# Patient Record
Sex: Female | Born: 1990 | Race: Black or African American | Hispanic: No | Marital: Single | Smoking: Current every day smoker
Health system: Southern US, Community
[De-identification: ages and names within clinical notes are randomized; demographics above are authoritative.]

## PROBLEM LIST (undated history)

## (undated) ENCOUNTER — Inpatient Hospital Stay: Payer: Self-pay

## (undated) DIAGNOSIS — F32A Depression, unspecified: Secondary | ICD-10-CM

## (undated) DIAGNOSIS — R519 Headache, unspecified: Secondary | ICD-10-CM

## (undated) DIAGNOSIS — F319 Bipolar disorder, unspecified: Secondary | ICD-10-CM

## (undated) DIAGNOSIS — F1911 Other psychoactive substance abuse, in remission: Secondary | ICD-10-CM

## (undated) DIAGNOSIS — O09299 Supervision of pregnancy with other poor reproductive or obstetric history, unspecified trimester: Secondary | ICD-10-CM

## (undated) DIAGNOSIS — A6000 Herpesviral infection of urogenital system, unspecified: Secondary | ICD-10-CM

## (undated) DIAGNOSIS — F419 Anxiety disorder, unspecified: Secondary | ICD-10-CM

## (undated) DIAGNOSIS — D649 Anemia, unspecified: Secondary | ICD-10-CM

## (undated) DIAGNOSIS — F329 Major depressive disorder, single episode, unspecified: Secondary | ICD-10-CM

## (undated) HISTORY — DX: Headache, unspecified: R51.9

## (undated) HISTORY — DX: Anxiety disorder, unspecified: F41.9

## (undated) HISTORY — DX: Bipolar disorder, unspecified: F31.9

## (undated) HISTORY — DX: Anemia, unspecified: D64.9

## (undated) HISTORY — DX: Depression, unspecified: F32.A

## (undated) HISTORY — PX: NO PAST SURGERIES: SHX2092

---
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
## 2013-03-15 ENCOUNTER — Emergency Department: Payer: Self-pay | Admitting: Emergency Medicine

## 2013-03-15 LAB — URINALYSIS, COMPLETE
Bilirubin,UR: NEGATIVE
Blood: NEGATIVE
Squamous Epithelial: 16
WBC UR: 19 /HPF (ref 0–5)

## 2013-03-15 LAB — WET PREP, GENITAL

## 2014-02-17 ENCOUNTER — Emergency Department: Payer: Self-pay | Admitting: Emergency Medicine

## 2014-02-17 LAB — CBC
HCT: 33.2 % — AB (ref 35.0–47.0)
HGB: 10.6 g/dL — ABNORMAL LOW (ref 12.0–16.0)
MCH: 27.5 pg (ref 26.0–34.0)
MCHC: 31.9 g/dL — ABNORMAL LOW (ref 32.0–36.0)
MCV: 86 fL (ref 80–100)
PLATELETS: 327 10*3/uL (ref 150–440)
RBC: 3.86 10*6/uL (ref 3.80–5.20)
RDW: 13.7 % (ref 11.5–14.5)
WBC: 13.6 10*3/uL — ABNORMAL HIGH (ref 3.6–11.0)

## 2014-02-17 LAB — COMPREHENSIVE METABOLIC PANEL
ALBUMIN: 2.7 g/dL — AB (ref 3.4–5.0)
ANION GAP: 5 — AB (ref 7–16)
AST: 16 U/L (ref 15–37)
Alkaline Phosphatase: 132 U/L — ABNORMAL HIGH
BUN: 3 mg/dL — ABNORMAL LOW (ref 7–18)
Bilirubin,Total: 0.3 mg/dL (ref 0.2–1.0)
CHLORIDE: 110 mmol/L — AB (ref 98–107)
CO2: 23 mmol/L (ref 21–32)
Calcium, Total: 8.4 mg/dL — ABNORMAL LOW (ref 8.5–10.1)
Creatinine: 0.52 mg/dL — ABNORMAL LOW (ref 0.60–1.30)
Glucose: 75 mg/dL (ref 65–99)
OSMOLALITY: 271 (ref 275–301)
Potassium: 3.9 mmol/L (ref 3.5–5.1)
SGPT (ALT): 9 U/L — ABNORMAL LOW (ref 12–78)
Sodium: 138 mmol/L (ref 136–145)
TOTAL PROTEIN: 6.7 g/dL (ref 6.4–8.2)

## 2014-03-30 ENCOUNTER — Inpatient Hospital Stay: Payer: Self-pay

## 2014-03-30 DIAGNOSIS — O1495 Unspecified pre-eclampsia, complicating the puerperium: Secondary | ICD-10-CM

## 2014-03-30 LAB — CBC WITH DIFFERENTIAL/PLATELET
BASOS ABS: 0 10*3/uL (ref 0.0–0.1)
BASOS PCT: 0.3 %
EOS PCT: 1.2 %
Eosinophil #: 0.2 10*3/uL (ref 0.0–0.7)
HCT: 34.2 % — AB (ref 35.0–47.0)
HGB: 11.3 g/dL — ABNORMAL LOW (ref 12.0–16.0)
LYMPHS ABS: 1.7 10*3/uL (ref 1.0–3.6)
LYMPHS PCT: 13.8 %
MCH: 27.7 pg (ref 26.0–34.0)
MCHC: 33.1 g/dL (ref 32.0–36.0)
MCV: 84 fL (ref 80–100)
Monocyte #: 1.1 x10 3/mm — ABNORMAL HIGH (ref 0.2–0.9)
Monocyte %: 8.6 %
NEUTROS PCT: 76.1 %
Neutrophil #: 9.5 10*3/uL — ABNORMAL HIGH (ref 1.4–6.5)
Platelet: 341 10*3/uL (ref 150–440)
RBC: 4.09 10*6/uL (ref 3.80–5.20)
RDW: 14.6 % — ABNORMAL HIGH (ref 11.5–14.5)
WBC: 12.5 10*3/uL — AB (ref 3.6–11.0)

## 2014-03-30 LAB — DRUG SCREEN, URINE
AMPHETAMINES, UR SCREEN: NEGATIVE (ref ?–1000)
BARBITURATES, UR SCREEN: NEGATIVE (ref ?–200)
BENZODIAZEPINE, UR SCRN: NEGATIVE (ref ?–200)
CANNABINOID 50 NG, UR ~~LOC~~: POSITIVE (ref ?–50)
Cocaine Metabolite,Ur ~~LOC~~: NEGATIVE (ref ?–300)
MDMA (Ecstasy)Ur Screen: NEGATIVE (ref ?–500)
Methadone, Ur Screen: NEGATIVE (ref ?–300)
OPIATE, UR SCREEN: NEGATIVE (ref ?–300)
PHENCYCLIDINE (PCP) UR S: NEGATIVE (ref ?–25)
Tricyclic, Ur Screen: NEGATIVE (ref ?–1000)

## 2014-03-31 LAB — HEMATOCRIT: HCT: 32.4 % — AB (ref 35.0–47.0)

## 2014-03-31 LAB — GC/CHLAMYDIA PROBE AMP

## 2014-04-05 ENCOUNTER — Inpatient Hospital Stay: Payer: Self-pay | Admitting: Internal Medicine

## 2014-04-05 LAB — CBC
HCT: 34.7 % — ABNORMAL LOW (ref 35.0–47.0)
HGB: 11.2 g/dL — AB (ref 12.0–16.0)
MCH: 27.6 pg (ref 26.0–34.0)
MCHC: 32.4 g/dL (ref 32.0–36.0)
MCV: 85 fL (ref 80–100)
PLATELETS: 379 10*3/uL (ref 150–440)
RBC: 4.07 10*6/uL (ref 3.80–5.20)
RDW: 15.2 % — ABNORMAL HIGH (ref 11.5–14.5)
WBC: 10.1 10*3/uL (ref 3.6–11.0)

## 2014-04-05 LAB — COMPREHENSIVE METABOLIC PANEL
ALBUMIN: 2.8 g/dL — AB (ref 3.4–5.0)
ALT: 55 U/L (ref 12–78)
Alkaline Phosphatase: 140 U/L — ABNORMAL HIGH
Anion Gap: 4 — ABNORMAL LOW (ref 7–16)
BUN: 7 mg/dL (ref 7–18)
Bilirubin,Total: 0.4 mg/dL (ref 0.2–1.0)
CALCIUM: 8.3 mg/dL — AB (ref 8.5–10.1)
Chloride: 114 mmol/L — ABNORMAL HIGH (ref 98–107)
Co2: 22 mmol/L (ref 21–32)
Creatinine: 0.89 mg/dL (ref 0.60–1.30)
EGFR (Non-African Amer.): >60
Glucose: 71 mg/dL (ref 65–99)
Osmolality: 276 (ref 275–301)
Potassium: 3.9 mmol/L (ref 3.5–5.1)
SGOT(AST): 43 U/L — ABNORMAL HIGH (ref 15–37)
SODIUM: 140 mmol/L (ref 136–145)
Total Protein: 6.7 g/dL (ref 6.4–8.2)

## 2014-04-05 LAB — URINALYSIS, COMPLETE
BILIRUBIN, UR: NEGATIVE
Bacteria: NONE SEEN
GLUCOSE, UR: NEGATIVE mg/dL (ref 0–75)
Ketone: NEGATIVE
NITRITE: NEGATIVE
PH: 6 (ref 4.5–8.0)
Protein: 100
RBC,UR: 17 /HPF (ref 0–5)
SPECIFIC GRAVITY: 1.018 (ref 1.003–1.030)
WBC UR: 35 /HPF (ref 0–5)

## 2014-04-05 LAB — TROPONIN I

## 2014-04-05 LAB — PRO B NATRIURETIC PEPTIDE: B-TYPE NATIURETIC PEPTID: 1462 pg/mL — AB (ref 0–125)

## 2014-04-05 LAB — CK TOTAL AND CKMB (NOT AT ARMC)
CK, TOTAL: 158 U/L
CK-MB: 1.8 ng/mL (ref 0.5–3.6)

## 2014-04-05 LAB — D-DIMER(ARMC): D-Dimer: 5541 ng/ml

## 2014-04-05 LAB — MAGNESIUM: Magnesium: 1.8 mg/dL

## 2014-04-06 LAB — BASIC METABOLIC PANEL
ANION GAP: 7 (ref 7–16)
BUN: 5 mg/dL — ABNORMAL LOW (ref 7–18)
CREATININE: 0.88 mg/dL (ref 0.60–1.30)
Calcium, Total: 7.5 mg/dL — ABNORMAL LOW (ref 8.5–10.1)
Chloride: 109 mmol/L — ABNORMAL HIGH (ref 98–107)
Co2: 24 mmol/L (ref 21–32)
EGFR (African American): >60
GLUCOSE: 80 mg/dL (ref 65–99)
OSMOLALITY: 276 (ref 275–301)
Potassium: 3.4 mmol/L — ABNORMAL LOW (ref 3.5–5.1)
Sodium: 140 mmol/L (ref 136–145)

## 2014-04-06 LAB — CBC WITH DIFFERENTIAL/PLATELET
BASOS ABS: 0 10*3/uL (ref 0.0–0.1)
Basophil %: 0.4 %
EOS ABS: 0.3 10*3/uL (ref 0.0–0.7)
Eosinophil %: 2.4 %
HCT: 33.8 % — AB (ref 35.0–47.0)
HGB: 10.8 g/dL — AB (ref 12.0–16.0)
Lymphocyte #: 1.9 10*3/uL (ref 1.0–3.6)
Lymphocyte %: 15.7 %
MCH: 27 pg (ref 26.0–34.0)
MCHC: 31.9 g/dL — AB (ref 32.0–36.0)
MCV: 85 fL (ref 80–100)
MONO ABS: 0.9 x10 3/mm (ref 0.2–0.9)
MONOS PCT: 7 %
NEUTROS PCT: 74.5 %
Neutrophil #: 9.2 10*3/uL — ABNORMAL HIGH (ref 1.4–6.5)
Platelet: 377 10*3/uL (ref 150–440)
RBC: 3.98 10*6/uL (ref 3.80–5.20)
RDW: 15.1 % — AB (ref 11.5–14.5)
WBC: 12.4 10*3/uL — AB (ref 3.6–11.0)

## 2014-04-06 LAB — HCG, QUANTITATIVE, PREGNANCY: Beta Hcg, Quant.: 19 m[IU]/mL — ABNORMAL HIGH

## 2014-04-06 LAB — MAGNESIUM: MAGNESIUM: 5.4 mg/dL — AB

## 2014-04-07 LAB — BASIC METABOLIC PANEL
ANION GAP: 10 (ref 7–16)
BUN: 8 mg/dL (ref 7–18)
CALCIUM: 8.4 mg/dL — AB (ref 8.5–10.1)
CHLORIDE: 109 mmol/L — AB (ref 98–107)
CO2: 21 mmol/L (ref 21–32)
Creatinine: 0.99 mg/dL (ref 0.60–1.30)
EGFR (African American): >60
EGFR (Non-African Amer.): >60
Glucose: 71 mg/dL (ref 65–99)
Osmolality: 276 (ref 275–301)
POTASSIUM: 3.7 mmol/L (ref 3.5–5.1)
SODIUM: 140 mmol/L (ref 136–145)

## 2014-04-07 LAB — CBC WITH DIFFERENTIAL/PLATELET
BASOS ABS: 0 10*3/uL (ref 0.0–0.1)
BASOS PCT: 0.3 %
EOS ABS: 0.3 10*3/uL (ref 0.0–0.7)
Eosinophil %: 2.4 %
HCT: 34.9 % — ABNORMAL LOW (ref 35.0–47.0)
HGB: 11.3 g/dL — ABNORMAL LOW (ref 12.0–16.0)
LYMPHS PCT: 14.7 %
Lymphocyte #: 1.5 10*3/uL (ref 1.0–3.6)
MCH: 27.4 pg (ref 26.0–34.0)
MCHC: 32.5 g/dL (ref 32.0–36.0)
MCV: 84 fL (ref 80–100)
MONOS PCT: 7.8 %
Monocyte #: 0.8 x10 3/mm (ref 0.2–0.9)
NEUTROS ABS: 7.7 10*3/uL — AB (ref 1.4–6.5)
Neutrophil %: 74.8 %
PLATELETS: 408 10*3/uL (ref 150–440)
RBC: 4.14 10*6/uL (ref 3.80–5.20)
RDW: 15.3 % — AB (ref 11.5–14.5)
WBC: 10.3 10*3/uL (ref 3.6–11.0)

## 2014-04-07 LAB — URINE CULTURE

## 2014-11-06 NOTE — L&D Delivery Note (Signed)
Delivery Note At 7:51 AM a viable female infant was delivered via Vaginal, Spontaneous Delivery (Presentation: Left Occiput Anterior).  APGAR: 8, 9; weight 7 lb 4.4 oz (3300 g).   Placenta status: Intact, Spontaneous. With three vessel cord  Anesthesia: None  Episiotomy: None Lacerations: None Suture Repair: N/A Est. Blood Loss (mL): 400  Mom to postpartum.  Baby to Couplet care / Skin to Skin.  GUTIERREZ, COLLEEN 07/04/2015, 8:15 AM

## 2014-12-16 LAB — OB RESULTS CONSOLE VARICELLA ZOSTER ANTIBODY, IGG: Varicella: IMMUNE

## 2014-12-16 LAB — OB RESULTS CONSOLE RUBELLA ANTIBODY, IGM: Rubella: IMMUNE

## 2015-02-21 ENCOUNTER — Observation Stay
Admit: 2015-02-21 | Disposition: A | Discharge status: Home / Self Care | Payer: Self-pay | Attending: Certified Nurse Midwife | Admitting: Certified Nurse Midwife

## 2015-02-22 ENCOUNTER — Encounter: Admit: 2015-02-22 | Payer: Self-pay | Admitting: Obstetrics and Gynecology

## 2015-02-27 NOTE — Discharge Summary (Signed)
PATIENT NAME:  Mary Terry, Mary Terry MR#:  [id number] DATE OF BIRTH:  Aug 23, 1991  DATE OF ADMISSION:  04/05/2014 DATE OF DISCHARGE:  04/07/2014  ADMITTING PHYSICIAN: Dr. Enid Terry  DISCHARGING PHYSICIAN:  Mary Baas, MD   PRIMARY CARE PHYSICIAN: None.  CONSULTATIONS IN THE HOSPITAL:  1.  OB/GYN consultation by Dr. Velora Terry.  2.  Surgical consultation by Dr. Juliann Terry.   DISCHARGE DIAGNOSES: 1.  Hypertensive urgency.  2.  Preeclampsia in the postpartum.  3.  Constipation.  4.  Postpartum vaginal bleeding.   DISCHARGE MEDICATIONS: 1.  Tylenol 650 mg q. 4 hours p.r.n. for pain or fever. 2.  Nifedipine 30 mg p.o. daily.  3.  Prenatal vitamins 1 tablet p.o. daily.   DISCHARGE DIET: Regular diet.   DISCHARGE ACTIVITY: As tolerated.  FOLLOWUP INSTRUCTIONS:  Follow up with OB/GYN, Dr. Tiburcio Terry in 2 days.  LABORATORY AND IMAGING STUDIES PRIOR TO DISCHARGE: Sodium 140, potassium 3.7, chloride 109, bicarb 21, BUN 8, creatinine 0.99, glucose 71 and calcium of 8.4.  WBC 10.3, hemoglobin 11.3, hematocrit 34.9, platelet count 408.  CT of the abdomen and pelvis with contrast showing constipation. No evidence of distal colonic obstruction and enlarged uterus, likely postpartum status noted.  Ultrasound of the pelvis showing thick and heterogeneous endometrial lining, likely endometritis. Enlarged and heterogeneous uterus with recent postpartum status noted.   Beta hCG slightly elevated at 19. KUB showing gaseous distention of primary colon with mild amount of stool and paucity of gas in the rectosigmoid colon, which is nonspecific.  CT angiography of the chest for PE showing no evidence of any pulmonary embolism.  Urinalysis:  1+ leuk esterase, no bacteria seen.  CT of the head without contrast showing normal CT head.  Urine cultures are negative.  BRIEF HOSPITAL COURSE:  Ms. Mary Terry is a 24 year old young African American female with no significant past medical history who  just had delivery, giving birth to a baby about a week ago, presents to the hospital secondary to severe chest pain, headache and right facial pain and noted to be in hypertensive urgency.  1.  Hypertensive urgency. Blood pressure is elevated on admission and she was requiring IV hydralazine and labetalol pushes as needed.  Other causes:  Her blood pressure has been, hypertension has been ruled out.  This is more likely secondary to eclampsia in the postpartum period, as patient is less than 1 week postpartum. She was seen by OB/GYN, admitted to Intensive Care Unit and started on magnesium infusion for 24 hours.  Her blood pressure is much better controlled and she is only on oral Procardia at this point, which she will be discharged on and followed up as an outpatient.  2.  Severe abdominal pain. The patient has severe abdominal pain day 2 of the hospitalization, and CT of the abdomen and pelvis was done and also pelvic ultrasound which showed gaseous distention and large uterus and possibly constipation. The patient was given laxatives, passed flatus and her pain has improved a lot. Her vaginal bleeding was slightly high and she was monitored under OB/GYN service and is being discharged home and will follow up with gynecology as an outpatient in 2 days.  3.  Her course has been otherwise uneventful in the hospital.   DISCHARGE CONDITION: Stable.   DISCHARGE DISPOSITION: Home.   TIME SPENT ON DISCHARGE:  40 minutes.   ____________________________ Mary Baas, MD rk:ce D: 04/07/2014 14:21:16 ET T: 04/07/2014 18:42:22 ET JOB#: [id number]  cc: Mary Baas, MD, <Dictator> R.  Annamarie MajorPaul Harris, MD Mary BaasADHIKA Laylee Schooley MD ELECTRONICALLY SIGNED 04/18/2014 14:18

## 2015-02-27 NOTE — H&P (Signed)
PATIENT NAME:  Mary Terry, ROVIRA MR#:  [id number] DATE OF BIRTH:  1991-06-03  DATE OF ADMISSION:  04/05/2014  ADMITTING PHYSICIAN: Enid Baas, M.D.   PRIMARY CARE PHYSICIAN: None.   CHIEF COMPLAINT: Headache.   HISTORY OF PRESENT ILLNESS:  Mary Terry is a 24 year old African American female with no significant past medical history, who has just given birth to a baby through normal vaginal delivery about 6 days ago. Comes to the hospital secondary to a severe headache and right facial pain. The patient had an uncomplicated pregnancy as well as delivery, got discharged about 4 days ago. One day after discharge, she started to have some headache which was all over the head going back to her neck. She thought it would get better; however, it got worse to the point that she had severe right facial pain, blurred vision, associated with that some dyspnea, chest pain and tightness secondary to dyspnea and presents to the hospital. She was noted to have proteinuria, hypertensive urgency with systolic blood pressure greater than 190s and being admitted for the same. No prior history of gestational hypertension or hypertension.   PAST MEDICAL HISTORY:  None.   PAST SURGICAL HISTORY: Normal vaginal delivery but no other surgeries.   ALLERGIES TO MEDICATIONS: No known drug allergies.   HOME MEDICATIONS:  Prenatal vitamin.   SOCIAL HISTORY: Lives at home. No history of any past history of smoking and no alcohol. Drug screen from last week was positive for marijuana.   FAMILY HISTORY:  No significant family history.   REVIEW OF SYSTEMS:    CONSTITUTIONAL: No fever, fatigue or weakness.  EYES: Positive for blurry vision. No inflammation, glaucoma or cataracts.  ENT: No tinnitus, ear pain, hearing loss, epistaxis or discharge.   RESPIRATORY:  Positive for dyspnea.  No cough, wheeze or chronic obstructive pulmonary disease.  CARDIOVASCULAR: Positive for chest pain, orthopnea, edema, arrhythmia,  palpitations, or syncope.  GASTROINTESTINAL: No nausea, vomiting, diarrhea, abdominal pain, hematemesis or melena.  GENITOURINARY: No dysuria, hematuria, renal calculus, frequency or incontinence.  ENDOCRINE: No polyuria, nocturia, thyroid problems or heat or cold intolerance.  HEMATOLOGY: Mild bleeding from vagina which is expected, but no other bleeding, bruising or anemia.  SKIN: No acne, rash or lesions.  MUSCULOSKELETAL: Neck pain present, no other arthritis or gout.  NEUROLOGIC: No numbness, weakness, cerebrovascular accident, transient ischemic attack or seizures.  PSYCHOLOGICAL: No anxiety, insomnia or depression.   PHYSICAL EXAMINATION: VITAL SIGNS: Temperature 98.4 degrees Fahrenheit, pulse 92, respirations 18, blood pressure 172/111, pulse oximetry 99% on room air.  GENERAL: Well-built, well-nourished female lying in bed, not in any acute distress.  HEENT: Normocephalic, atraumatic.  Pupils equal, round, reacting to light. Anicteric sclerae. Extraocular movements intact. Oropharynx clear without erythema, mass or exudates.  NECK: Supple. No thyromegaly, JVD or carotid bruits. No lymphadenopathy.  LUNGS: Moving air bilaterally. No wheeze or crackles. No use of accessory muscles for breathing.  CARDIOVASCULAR: S1 and S2.  Regular rate and rhythm. No murmurs, rubs or gallops.  ABDOMEN: Soft, nontender, nondistended. No hepatosplenomegaly. Normal bowel sounds.  EXTREMITIES: No pedal edema. No clubbing or cyanosis. Has 2+ distally pedis pulses palpable bilaterally.  SKIN: No acne, rash or lesions.  LYMPHATICS: No cervical lymphadenopathy.    NEUROLOGIC: Cranial nerves intact. No focal motor or sensory deficits.  PSYCHOLOGIC: The patient is awake, alert, oriented x 3.   LABORATORY, DIAGNOSTIC AND RADIOLOGICAL DATA: 1.  WBC 10.1, hemoglobin 11.3, hematocrit 34.7, platelet count is 379.  2.  Sodium 140, potassium  3.9, chloride 114, bicarbonate 22, BUN 10, creatinine 0.89, glucose 91,  calcium of 8.3.  3.  ALT 55, AST 43, alkaline phosphatase 142, total bilirubin 0.4, albumin of 3.8.  4.  Troponin less than 0.02. D-dimer 5541.  BNP is elevated at 1462. Magnesium normal at 1.8.  5.  Urinalysis with 1+ leukocyte esterase, 100 mg/dL of protein, no bacteria seen.  6.  Chest x-ray showing clear lung fields, no acute cardiopulmonary disease.  7.  CT of the head without contrast showing normal CT head without any acute changes.  8.  CT angiogram of the chest showing no evidence of any PE.  9.  EKG showing just normal sinus rhythm, no acute ST-T wave abnormalities, heart rate of 62.   ASSESSMENT AND PLAN: A 24 year old female with no past medical history, 1 week postpartum, comes with severe headache and dyspnea, noted to have hypertension.  1.  Hypertensive urgency with accelerated hypertension, no other causes. Likely preeclampsia and postpartum.  2.  Proteinuria, hypertension and elevated liver function tests. Admit to Critical Care Unit for magnesium infusion, IV hydralazine and labetalol pushes p.r.n. If does not improve, we will do a  continuous drip.   3.  Preeclampsia. Continue monitoring patient for 24 hours. Obgyn has been consulted. The patient will hold breast-feeding anyway for 24 hours as contrast given for CT chest. Continue to monitor.  4.  Headache and right facial pain. CT of the head is normal. Pain medications p.r.n.  5.  CODE STATUS: Full code.   TIME SPENT ON ADMISSION: 50 minutes.    ____________________________ Enid Baasadhika Dejon Lukas, MD rk:cs D: 04/05/2014 18:06:16 ET T: 04/05/2014 18:35:52 ET JOB#: [id number]  cc: Enid Baasadhika Adom Schoeneck, MD, <Dictator> Enid BaasADHIKA Donell Tomkins MD ELECTRONICALLY SIGNED 04/18/2014 14:14

## 2015-02-27 NOTE — Consult Note (Signed)
Brief Consult Note: Diagnosis: Hypertension Urgency.  Postpartum, possible preclampsia.   Patient was seen by consultant.   Consult note dictated.   Recommend further assessment or treatment.   Discussed with Attending MD.   Comments: AGree w plan for CCU admission for Magnesium IV therapy as well as antihypertensive therapy.  Anticipate reversal of symptoms, patient has no prior HTN disorders and is young and healthy.  Breast feeding.  Will follow w you.  Electronic Signatures: Letitia LibraHarris, Latrell Potempa Paul (MD)  (Signed 31-May-15 17:52)  Authored: Brief Consult Note   Last Updated: 31-May-15 17:52 by Letitia LibraHarris, Oluwatobi Ruppe Paul (MD)

## 2015-02-27 NOTE — Consult Note (Signed)
PATIENT NAME:  Mary Terry, [name] MR#:  [id number] DATE OF BIRTH:  10-02-1991  DATE OF CONSULTATION:  04/06/2014  CONSULTING PHYSICIAN:  Cristal Deerhristopher A. Perseus Westall, MD  REASON FOR CONSULTATION: Suprapubic abdominal pain, dilation of proximal colon.   HISTORY OF PRESENT ILLNESS: Mary Terry is a pleasant 24 year old female who was recently admitted for normal vaginal delivery approximately 7 days ago. She presented yesterday with severe headache, right facial pain, was noted to have proteinuria and hypertensive urgency, which has since improved.  Pain had since improved; however, she developed suprapubic abdominal pain this morning. She says that since surgery, she did not have a bowel movement for approximately 3 days and then had a small bowel movement on day 4 and only had a recent significant bowel movement yesterday. She says that she feels better after passing gas today. Otherwise, no fevers, chills, night sweats, shortness of breath, cough, chest pain, dysuria or hematuria.   PAST MEDICAL HISTORY:  Admitted for hypertensive urgency, proteinuria, preeclampsia.    PAST SURGICAL HISTORY:  No history of surgeries.   ALLERGIES:  No known drug allergies.   HOME MEDICATIONS:  Prenatal vitamins.  SOCIAL HISTORY:  Denies alcohol or tobacco. Positive last week drug screen for marijuana.   FAMILY HISTORY: No significant family history.   REVIEW OF SYSTEMS: A 12 point review of systems review of systems was obtained. Pertinent positives and negatives as above.   PHYSICAL EXAMINATION: VITAL SIGNS: Temperature 97.5, pulse 82, blood pressure 127/76, O2 saturation is 100%.  GENERAL: No acute distress. Alert and oriented x 3.  HEAD: Normocephalic, atraumatic.  EYES: No scleral icterus. No conjunctivitis.  FACE: No obvious facial trauma. Normal external nose. Normal external ears.  CHEST: Lungs clear to auscultation, moving air well.  HEART: Regular rate and rhythm. No murmurs, rubs or gallops.   ABDOMEN: Soft, mildly tender to palpation in the suprapubic regions.  EXTREMITIES: Moves all extremities well. Strength 5/5.  NEUROLOGIC: Cranial nerves II through XII grossly intact.   LABORATORY DATA:  Significant for white cell count of 12.4, hemoglobin 10.8, hematocrit 33.8. Neutrophils are 74.5. Urinalysis positive for 1+ leukocyte esterase, 35 white cells.   IMAGING: KUB shows a proximal dilated colon with gaseous distention and possibly gas in the retrosigmoid colon.   ASSESSMENT AND PLAN: Mary Terry is a pleasant 24 year old female status post spontaneous vaginal delivery and likely preeclampsia, is worked up with ultrasound by OB for retained gestational products. The patient does have suprapubic pain.  Do not feel that it is appendicitis; however, could bowel gas findings be associated with large post gravid uterus or possibly a periuterine hematoma, the patient's blood count has not significantly dropped.  We will follow up on  p.o. contrast CT scan. No obvious surgical intervention was noted at this time.   ____________________________ Si Raiderhristopher A. Dorraine Ellender, MD cal:dmm D: 04/06/2014 16:09:07 ET T: 04/06/2014 19:02:48 ET JOB#: [id number]  cc: Cristal Deerhristopher A. Quantisha Marsicano, MD, <Dictator> Jarvis NewcomerHRISTOPHER A Daveena Elmore MD ELECTRONICALLY SIGNED 04/07/2014 17:18

## 2015-03-16 NOTE — H&P (Signed)
L&D Evaluation:  History:  HPI Pt is a 24 yo G2P1 at 39.[redacted] weeks GA with an EDC of 03/31/14 who presents to L&D with reports of contractions that started at 9 am this morning. She reports +FM, denies vb or lof. 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  HEENT, CNS, GI, GU, Respiratory, CV, Renal and Musculoskeletal systems were found to be normal.   Exam:  Vital Signs stable   General no apparent distress   Mental Status clear   Chest clear   Heart normal sinus rhythm   Abdomen gravid, tender with contractions   Pelvic 6/90/-1  AROM at 2144 for clear fluid   Mebranes Intact   FHT normal rate with no decels, 120-130's   Ucx regular, Q5   Skin dry, no lesions, no rashes   Lymph no lymphadenopathy   Impression:  Impression active labor, reactive NST   Plan:  Plan EFM/NST, monitor contractions and for cervical change, anticipate svd, AROM   Follow Up Appointment need to schedule. in 6 weeks   Electronic Signatures: Jannet MantisSubudhi, Ari Engelbrecht (CNM)  (Signed 25-May-15 23:11)  Authored: L&D Evaluation   Last Updated: 25-May-15 23:11 by Jannet MantisSubudhi, [place] Dube (CNM)

## 2015-05-03 ENCOUNTER — Encounter: Payer: Self-pay | Admitting: *Deleted

## 2015-05-03 ENCOUNTER — Observation Stay
Admission: EM | Admit: 2015-05-03 | Discharge: 2015-05-03 | Disposition: A | Discharge status: Home / Self Care | Payer: Medicaid Other | Attending: Certified Nurse Midwife | Admitting: Certified Nurse Midwife

## 2015-05-03 DIAGNOSIS — Z3490 Encounter for supervision of normal pregnancy, unspecified, unspecified trimester: Secondary | ICD-10-CM | POA: Insufficient documentation

## 2015-05-03 DIAGNOSIS — Z3A31 31 weeks gestation of pregnancy: Secondary | ICD-10-CM | POA: Insufficient documentation

## 2015-05-03 DIAGNOSIS — N766 Ulceration of vulva: Secondary | ICD-10-CM

## 2015-05-03 DIAGNOSIS — O23593 Infection of other part of genital tract in pregnancy, third trimester: Principal | ICD-10-CM | POA: Insufficient documentation

## 2015-05-03 DIAGNOSIS — R102 Pelvic and perineal pain: Secondary | ICD-10-CM | POA: Diagnosis present

## 2015-05-03 DIAGNOSIS — B9689 Other specified bacterial agents as the cause of diseases classified elsewhere: Secondary | ICD-10-CM | POA: Diagnosis not present

## 2015-05-03 DIAGNOSIS — N760 Acute vaginitis: Secondary | ICD-10-CM | POA: Insufficient documentation

## 2015-05-03 DIAGNOSIS — N9489 Other specified conditions associated with female genital organs and menstrual cycle: Secondary | ICD-10-CM | POA: Diagnosis present

## 2015-05-03 LAB — CHLAMYDIA/NGC RT PCR (ARMC ONLY)
CHLAMYDIA TR: NOT DETECTED
N GONORRHOEAE: NOT DETECTED

## 2015-05-03 LAB — ABO/RH: ABO/RH(D): O POS

## 2015-05-03 MED ORDER — FLUCONAZOLE 50 MG PO TABS
ORAL_TABLET | ORAL | Status: AC
  Administered 2015-05-03: 150 mg via ORAL
  Filled 2015-05-03: qty 3

## 2015-05-03 MED ORDER — ACYCLOVIR 5 % EX OINT
TOPICAL_OINTMENT | CUTANEOUS | Status: DC
  Administered 2015-05-03: 12:00:00 via TOPICAL
  Filled 2015-05-03: qty 30

## 2015-05-03 MED ORDER — VALACYCLOVIR HCL 500 MG PO TABS
1000.0000 mg | ORAL_TABLET | Freq: Two times a day (BID) | ORAL | Status: DC
  Administered 2015-05-03: 1000 mg via ORAL
  Filled 2015-05-03 (×2): qty 2

## 2015-05-03 MED ORDER — NYSTATIN-TRIAMCINOLONE 100000-0.1 UNIT/GM-% EX CREA
TOPICAL_CREAM | Freq: Two times a day (BID) | CUTANEOUS | Status: DC | PRN
  Administered 2015-05-03: 12:00:00 via TOPICAL
  Filled 2015-05-03: qty 15

## 2015-05-03 MED ORDER — FLUCONAZOLE 50 MG PO TABS
150.0000 mg | ORAL_TABLET | Freq: Once | ORAL | Status: AC
  Administered 2015-05-03: 150 mg via ORAL

## 2015-05-03 NOTE — Discharge Summary (Addendum)
Pt d/c'd to home with family. CNM saw pt and explained prescription and care of HSV during pregnancy. Prescriptions and d/c instructions given. Pt verbalized understanding. Follow up appointment made previously. Pt in stable condition upon d/c. No further concerns.

## 2015-05-03 NOTE — OB Triage Note (Signed)
Vaginal pain that started two days ago with some burning

## 2015-05-03 NOTE — Discharge Instructions (Signed)
Get plenty of rest, drink plenty of fluids

## 2015-05-04 LAB — TYPE AND SCREEN
ABO/RH(D): O POS
ANTIBODY SCREEN: NEGATIVE
DAT, IgG: NEGATIVE
WEAK D: POSITIVE

## 2015-05-04 LAB — HSV(HERPES SIMPLEX VRS) I + II AB-IGG: HSV 2 Glycoprotein G Ab, IgG: 1.3 index — ABNORMAL HIGH (ref 0.00–0.90)

## 2015-05-04 LAB — HSV CULTURE AND TYPING

## 2015-05-04 NOTE — Progress Notes (Signed)
S: Presents to L&D who complaints of a swollen, painful vaginal area x2 days.  Also reports burning of skin when urinating. Denies discharge or vulvar itching. Baby active.  O: 24 year old G3 76P2002 with EDC=07/05/2015 by LMP and c/b 11 week ultrasound presents at 31 week with above complaints.PNC at [facility] remarkable for obesity, smoking, past hx of cocaine and MJ use but negative UDS in January, and a hx of postpartum preeclampsia. Not seen between 22 and 30 weeks for prenatal care. Negative first trimester test, but had Down risk of 1/300.  Normal anatomy scan. Positive RPR, but negative TP test. Has a hx of positive HSV II IGG, but denies HSV outbreaks in past  Exam: 114/53 Appears uncomfortable Vulva: swollen right lower labia majora with several small ulcerations noted that are very tender to touch Vagina: white discharge Wet prep positive for hyphae and negative for Trich and clue cells HSV culture done on lesions Cervix closed, and long FHR 140 baseline with accelserations to 160 to 180, moderate variability Toco: acontractile  A: IUP at 31 weeks with suspected HSV outbreak Monilial vulvovaginaitis Reactive NST  P: HSVII IGG (did not see she had positive IGG prior to ordering) Start Valtrex 1 gm BID x 7 days then daily until she delivers Acyclovir ointment q3hours topically Diflucan 150 mgm x1 Mycolog II BID [facility] Can use cool or warm soaks or compresses

## 2015-05-05 LAB — HSV CULTURE AND TYPING

## 2015-05-13 LAB — OB RESULTS CONSOLE HIV ANTIBODY (ROUTINE TESTING): HIV: NONREACTIVE

## 2015-05-13 LAB — OB RESULTS CONSOLE RPR: RPR: REACTIVE

## 2015-06-07 LAB — OB RESULTS CONSOLE GBS
GBS: NEGATIVE
GBS: POSITIVE
STREP GROUP B AG: POSITIVE

## 2015-06-07 LAB — OB RESULTS CONSOLE GC/CHLAMYDIA
Chlamydia: NEGATIVE
Gonorrhea: NEGATIVE

## 2015-06-08 LAB — OB RESULTS CONSOLE GBS: STREP GROUP B AG: POSITIVE

## 2015-06-27 ENCOUNTER — Encounter: Payer: Self-pay | Admitting: *Deleted

## 2015-06-27 ENCOUNTER — Observation Stay
Admission: EM | Admit: 2015-06-27 | Discharge: 2015-06-27 | Disposition: A | Discharge status: Home / Self Care | Payer: Medicaid Other | Attending: Obstetrics and Gynecology | Admitting: Obstetrics and Gynecology

## 2015-06-27 DIAGNOSIS — R519 Headache, unspecified: Secondary | ICD-10-CM | POA: Diagnosis present

## 2015-06-27 DIAGNOSIS — R51 Headache: Secondary | ICD-10-CM | POA: Diagnosis present

## 2015-06-27 DIAGNOSIS — O26893 Other specified pregnancy related conditions, third trimester: Secondary | ICD-10-CM | POA: Diagnosis not present

## 2015-06-27 DIAGNOSIS — Z3A38 38 weeks gestation of pregnancy: Secondary | ICD-10-CM | POA: Diagnosis not present

## 2015-06-27 LAB — COMPREHENSIVE METABOLIC PANEL
ALK PHOS: 197 U/L — AB (ref 38–126)
ALT: 11 U/L — ABNORMAL LOW (ref 14–54)
AST: 16 U/L (ref 15–41)
Albumin: 2.7 g/dL — ABNORMAL LOW (ref 3.5–5.0)
Anion gap: 8 (ref 5–15)
BUN: <5 mg/dL — ABNORMAL LOW (ref 6–20)
CHLORIDE: 107 mmol/L (ref 101–111)
CO2: 21 mmol/L — AB (ref 22–32)
CREATININE: 0.64 mg/dL (ref 0.44–1.00)
Calcium: 8.6 mg/dL — ABNORMAL LOW (ref 8.9–10.3)
GFR calc Af Amer: >60 mL/min (ref >60)
GFR calc non Af Amer: >60 mL/min (ref >60)
GLUCOSE: 76 mg/dL (ref 65–99)
Potassium: 3.9 mmol/L (ref 3.5–5.1)
SODIUM: 136 mmol/L (ref 135–145)
Total Bilirubin: 0.4 mg/dL (ref 0.3–1.2)
Total Protein: 6.3 g/dL — ABNORMAL LOW (ref 6.5–8.1)

## 2015-06-27 LAB — PROTEIN / CREATININE RATIO, URINE
CREATININE, URINE: 74 mg/dL
Protein Creatinine Ratio: 0.16 mg/mg{Cre} — ABNORMAL HIGH (ref 0.00–0.15)
TOTAL PROTEIN, URINE: 12 mg/dL

## 2015-06-27 LAB — URINALYSIS COMPLETE WITH MICROSCOPIC (ARMC ONLY)
Bilirubin Urine: NEGATIVE
GLUCOSE, UA: NEGATIVE mg/dL
HGB URINE DIPSTICK: NEGATIVE
LEUKOCYTES UA: NEGATIVE
Nitrite: NEGATIVE
Protein, ur: NEGATIVE mg/dL
Specific Gravity, Urine: 1.009 (ref 1.005–1.030)
pH: 7 (ref 5.0–8.0)

## 2015-06-27 LAB — CBC
HCT: 32.2 % — ABNORMAL LOW (ref 35.0–47.0)
Hemoglobin: 10.4 g/dL — ABNORMAL LOW (ref 12.0–16.0)
MCH: 25.8 pg — ABNORMAL LOW (ref 26.0–34.0)
MCHC: 32.2 g/dL (ref 32.0–36.0)
MCV: 80 fL (ref 80.0–100.0)
PLATELETS: 305 10*3/uL (ref 150–440)
RBC: 4.02 MIL/uL (ref 3.80–5.20)
RDW: 16 % — ABNORMAL HIGH (ref 11.5–14.5)
WBC: 12.1 10*3/uL — AB (ref 3.6–11.0)

## 2015-06-27 MED ORDER — ACETAMINOPHEN 325 MG PO TABS
ORAL_TABLET | ORAL | Status: AC
  Administered 2015-06-27: 650 mg via ORAL
  Filled 2015-06-27: qty 2

## 2015-06-27 MED ORDER — ACETAMINOPHEN 325 MG PO TABS
650.0000 mg | ORAL_TABLET | ORAL | Status: DC | PRN
  Administered 2015-06-27: 650 mg via ORAL

## 2015-06-27 NOTE — OB Triage Note (Signed)
HA since 0700 this AM. Contractions since 0700. "Not close enough together." [name]

## 2015-06-27 NOTE — H&P (Signed)
Obstetric H&P   Chief Complaint: Headache  Prenatal Care Provider: [facility]  History of Present Illness: 24 y.o. [id number] [redacted]w[redacted]d by 07/05/2015, with a history of postpartum preeclampsia, normotensive thus far this pregnancy, who reports intractable headache starting this morning not relieved by Tylenol.  +FM, no LOF, no VB.  O pos / ABSC neg / RI / VZI / RPR reactive 1:4 with negative FTA antibodies / DSR 1:300 / negative MSAFP / hemoglobin AA / 1-hr 97 / GBS negative  History of THC and cocaine use  Review of Systems: 10 point review of systems negative unless otherwise noted in HPI  Past Medical History: History reviewed. No pertinent past medical history.  Past Surgical History: History reviewed. No pertinent past surgical history.  Obstetric History: OB History  Gravida Para Term Preterm AB SAB TAB Ectopic Multiple Living  3 2 2       2     # Outcome Date GA Lbr Len/2nd Weight Sex Delivery Anes PTL Lv  3 Current           2 Term 03/30/14    Reuben Likes  1 Term 01/15/10    F Vag-Spont   Y      Family History: Family History  Problem Relation Age of Onset  . Family history unknown: Yes    Social History: Social History   Social History  . Marital Status: Married    Spouse Name: N/A  . Number of Children: N/A  . Years of Education: N/A   Occupational History  . Not on file.   Social History Main Topics  . Smoking status: Current Every Day Smoker -- 0.25 packs/day for 3 years    Types: Cigarettes  . Smokeless tobacco: Never Used  . Alcohol Use: No  . Drug Use: Yes    Special: Marijuana  . Sexual Activity: Yes   Other Topics Concern  . Not on file   Social History Narrative    Medications: Prior to Admission medications   Medication Sig Start Date End Date Taking? Authorizing Provider  Prenatal Vit-Fe Fumarate-FA (MULTIVITAMIN-PRENATAL) 27-0.8 MG TABS tablet Take 1 tablet by mouth daily at 12 noon.   Yes Historical Provider, MD  valACYclovir (VALTREX) 1000 MG  tablet Take 500 mg by mouth daily. 04/27/15  Yes Historical Provider, MD  neomycin-bacitracin-polymyxin (NEOSPORIN) ointment Apply 1 application topically as needed for wound care. apply to eye    Historical Provider, MD    Allergies: No Known Allergies  Physical Exam: Filed Vitals:   06/27/15 1412  BP: 145/70  Pulse: 75  Temp: 98.3 F (36.8 C)  TempSrc: Oral  Resp: 20    Urine Dip Protein: P/C ratio pending  FHT: 130, moderate, +accels, no decels Toco: rare  General: NAD HEENT: normocephalic, anicteric Pulmonary: CTAB Cardiovascular: RRR Abdomen: Gravid,  Non-tender, non-distended Extremities: no edema  Labs: Results for orders placed or performed during the hospital encounter of 06/27/15 (from the past 24 hour(s))  Comprehensive metabolic panel     Status: Abnormal   Collection Time: 06/27/15  2:37 PM  Result Value Ref Range   Sodium 136 135 - 145 mmol/L   Potassium 3.9 3.5 - 5.1 mmol/L   Chloride 107 101 - 111 mmol/L   CO2 21 (L) 22 - 32 mmol/L   Glucose, Bld 76 65 - 99 mg/dL   BUN <5 (L) 6 - 20 mg/dL   Creatinine, Ser 4.54 0.44 - 1.00 mg/dL   Calcium 8.6 (L) 8.9 - 10.3  mg/dL   Total Protein 6.3 (L) 6.5 - 8.1 g/dL   Albumin 2.7 (L) 3.5 - 5.0 g/dL   AST 16 15 - 41 U/L   ALT 11 (L) 14 - 54 U/L   Alkaline Phosphatase 197 (H) 38 - 126 U/L   Total Bilirubin 0.4 0.3 - 1.2 mg/dL   GFR calc non Af Amer >60 >60 mL/min   GFR calc Af Amer >60 >60 mL/min   Anion gap 8 5 - 15  CBC     Status: Abnormal   Collection Time: 06/27/15  2:37 PM  Result Value Ref Range   WBC 12.1 (H) 3.6 - 11.0 K/uL   RBC 4.02 3.80 - 5.20 MIL/uL   Hemoglobin 10.4 (L) 12.0 - 16.0 g/dL   HCT 16.1 (L) 09.6 - 04.5 %   MCV 80.0 80.0 - 100.0 fL   MCH 25.8 (L) 26.0 - 34.0 pg   MCHC 32.2 32.0 - 36.0 g/dL   RDW 40.9 (H) 81.1 - 91.4 %   Platelets 305 150 - 440 K/uL    Assessment: 23 y.o. [id number] [redacted]w[redacted]d by 07/05/2015, presenting with headaches  Plan: 1) Elevated blood pressure  - PIH labs  negative - P/C ratio pending  2) Fetus - category I tracing  3) O pos / ABSC neg / RI / VZI / RPR reactive 1:4 with negative FTA antibodies / DSR 1:300 / negative MSAFP / hemoglobin AA / 1-hr 97 / GBS negative

## 2015-06-27 NOTE — Progress Notes (Signed)
Obstetric and Gynecology  Subjective  Headache has resolved after tylenol, some irregular contractions  Objective   Filed Vitals:   06/27/15 1412 06/27/15 1546 06/27/15 1600 06/27/15 1615  BP: 145/70 123/69 122/72 116/71  Pulse: 75 69 66 83  Temp: 98.3 F (36.8 C)     TempSrc: Oral     Resp: 20 20     No intake or output data in the 24 hours ending 06/27/15 1629  General: NAD Abdomen: gravid, soft nontender Cervix: 1.5/50/-3 Extremities:no edema  Labs: Results for orders placed or performed during the hospital encounter of 06/27/15 (from the past 24 hour(s))  Comprehensive metabolic panel     Status: Abnormal   Collection Time: 06/27/15  2:37 PM  Result Value Ref Range   Sodium 136 135 - 145 mmol/L   Potassium 3.9 3.5 - 5.1 mmol/L   Chloride 107 101 - 111 mmol/L   CO2 21 (L) 22 - 32 mmol/L   Glucose, Bld 76 65 - 99 mg/dL   BUN <5 (L) 6 - 20 mg/dL   Creatinine, Ser 6.96 0.44 - 1.00 mg/dL   Calcium 8.6 (L) 8.9 - 10.3 mg/dL   Total Protein 6.3 (L) 6.5 - 8.1 g/dL   Albumin 2.7 (L) 3.5 - 5.0 g/dL   AST 16 15 - 41 U/L   ALT 11 (L) 14 - 54 U/L   Alkaline Phosphatase 197 (H) 38 - 126 U/L   Total Bilirubin 0.4 0.3 - 1.2 mg/dL   GFR calc non Af Amer >60 >60 mL/min   GFR calc Af Amer >60 >60 mL/min   Anion gap 8 5 - 15  CBC     Status: Abnormal   Collection Time: 06/27/15  2:37 PM  Result Value Ref Range   WBC 12.1 (H) 3.6 - 11.0 K/uL   RBC 4.02 3.80 - 5.20 MIL/uL   Hemoglobin 10.4 (L) 12.0 - 16.0 g/dL   HCT 29.5 (L) 28.4 - 13.2 %   MCV 80.0 80.0 - 100.0 fL   MCH 25.8 (L) 26.0 - 34.0 pg   MCHC 32.2 32.0 - 36.0 g/dL   RDW 44.0 (H) 10.2 - 72.5 %   Platelets 305 150 - 440 K/uL  Protein / creatinine ratio, urine     Status: Abnormal   Collection Time: 06/27/15  3:15 PM  Result Value Ref Range   Creatinine, Urine 74 mg/dL   Total Protein, Urine 12 mg/dL   Protein Creatinine Ratio 0.16 (H) 0.00 - 0.15 mg/mg[Cre]  Urinalysis complete, with microscopic ([facility] only)      Status: Abnormal   Collection Time: 06/27/15  3:15 PM  Result Value Ref Range   Color, Urine YELLOW (A) YELLOW   APPearance CLEAR (A) CLEAR   Glucose, UA NEGATIVE NEGATIVE mg/dL   Bilirubin Urine NEGATIVE NEGATIVE   Ketones, ur 2+ (A) NEGATIVE mg/dL   Specific Gravity, Urine 1.009 1.005 - 1.030   Hgb urine dipstick NEGATIVE NEGATIVE   pH 7.0 5.0 - 8.0   Protein, ur NEGATIVE NEGATIVE mg/dL   Nitrite NEGATIVE NEGATIVE   Leukocytes, UA NEGATIVE NEGATIVE   RBC / HPF 0-5 0 - 5 RBC/hpf   WBC, UA 0-5 0 - 5 WBC/hpf   Bacteria, UA RARE (A) NONE SEEN   Squamous Epithelial / LPF 0-5 (A) NONE SEEN   Mucous PRESENT     Cultures: Results for orders placed or performed during the hospital encounter of 05/03/15  Hsv Culture And Typing     Status: None  Collection Time: 05/03/15 11:40 AM  Result Value Ref Range Status   HSV Culture/Type REORDERED. SEE [id number] FOR REPORT  Final   Source of Sample ENDOCERVICAL  Final  Chlamydia/NGC rt PCR ([facility] only)     Status: None   Collection Time: 05/03/15 11:40 AM  Result Value Ref Range Status   Specimen source GC/Chlam ENDOCERVICAL  Final  Chlamydia/NGC rt PCR ([facility] only)     Status: None   Collection Time: 05/03/15 11:40 AM  Result Value Ref Range Status   Specimen source GC/Chlam URINE, RANDOM  Final   Chlamydia Tr NOT DETECTED NOT DETECTED Final   N gonorrhoeae NOT DETECTED NOT DETECTED Final    Comment: (NOTE) 100  This methodology has not been evaluated in pregnant women or in 200  patients with a history of hysterectomy. 300 400  This methodology will not be performed on patients less than 20  years of age.   Hsv Culture And Typing     Status: Abnormal   Collection Time: 05/03/15 11:40 AM  Result Value Ref Range Status   HSV Culture/Type Comment (A)  Final    Comment: (NOTE) Positive for Herpes simplex virus type-2. Typing was confirmed by monoclonal antibody microscopic immunofluorescence. Performed At: [facility] [address] [place], [place] [id number] Mila Homer MD [id number]    Source of Sample HSV CULTURE & TYPING  Final    Imaging:  Assessment   24 y.o. G3P2002 at [redacted]w[redacted]d presenting with headache, resolved with administration of tyelnol  Plan   1) HA - no evidence of preeclampsia based on labs, other than initial systolic of 145 remainder of BP all well within the normotensive range.  Headache resolved with tylenol  2) Fetus - cat I tracing/reactive NST  3) Disposition - discharge home, follow up 06/28/15 [facility]

## 2015-07-04 ENCOUNTER — Encounter: Payer: Self-pay | Admitting: Certified Nurse Midwife

## 2015-07-04 ENCOUNTER — Inpatient Hospital Stay
Admission: EM | Admit: 2015-07-04 | Discharge: 2015-07-06 | DRG: 775 | Disposition: A | Discharge status: Home / Self Care | Payer: Medicaid Other | Attending: Certified Nurse Midwife | Admitting: Certified Nurse Midwife

## 2015-07-04 DIAGNOSIS — O99824 Streptococcus B carrier state complicating childbirth: Secondary | ICD-10-CM | POA: Diagnosis present

## 2015-07-04 DIAGNOSIS — O98319 Other infections with a predominantly sexual mode of transmission complicating pregnancy, unspecified trimester: Secondary | ICD-10-CM | POA: Diagnosis present

## 2015-07-04 DIAGNOSIS — F1290 Cannabis use, unspecified, uncomplicated: Secondary | ICD-10-CM | POA: Diagnosis present

## 2015-07-04 DIAGNOSIS — O99324 Drug use complicating childbirth: Secondary | ICD-10-CM | POA: Diagnosis present

## 2015-07-04 DIAGNOSIS — O9902 Anemia complicating childbirth: Secondary | ICD-10-CM | POA: Diagnosis present

## 2015-07-04 DIAGNOSIS — F17210 Nicotine dependence, cigarettes, uncomplicated: Secondary | ICD-10-CM | POA: Diagnosis present

## 2015-07-04 DIAGNOSIS — Z3A39 39 weeks gestation of pregnancy: Secondary | ICD-10-CM | POA: Diagnosis present

## 2015-07-04 DIAGNOSIS — D500 Iron deficiency anemia secondary to blood loss (chronic): Secondary | ICD-10-CM | POA: Diagnosis present

## 2015-07-04 DIAGNOSIS — F1911 Other psychoactive substance abuse, in remission: Secondary | ICD-10-CM

## 2015-07-04 DIAGNOSIS — O99334 Smoking (tobacco) complicating childbirth: Secondary | ICD-10-CM | POA: Diagnosis present

## 2015-07-04 HISTORY — DX: Supervision of pregnancy with other poor reproductive or obstetric history, unspecified trimester: O09.299

## 2015-07-04 HISTORY — DX: Herpesviral infection of urogenital system, unspecified: A60.00

## 2015-07-04 HISTORY — DX: Other psychoactive substance abuse, in remission: F19.11

## 2015-07-04 LAB — TYPE AND SCREEN
ABO/RH(D): O POS
ANTIBODY SCREEN: NEGATIVE
Weak D: POSITIVE

## 2015-07-04 LAB — URINE DRUG SCREEN, QUALITATIVE (ARMC ONLY)
Amphetamines, Ur Screen: NOT DETECTED
BARBITURATES, UR SCREEN: NOT DETECTED
BENZODIAZEPINE, UR SCRN: NOT DETECTED
CANNABINOID 50 NG, UR ~~LOC~~: POSITIVE — AB
Cocaine Metabolite,Ur ~~LOC~~: NOT DETECTED
MDMA (Ecstasy)Ur Screen: NOT DETECTED
METHADONE SCREEN, URINE: NOT DETECTED
OPIATE, UR SCREEN: NOT DETECTED
Phencyclidine (PCP) Ur S: NOT DETECTED
TRICYCLIC, UR SCREEN: NOT DETECTED

## 2015-07-04 LAB — CBC
HEMATOCRIT: 33.4 % — AB (ref 35.0–47.0)
HEMOGLOBIN: 10.7 g/dL — AB (ref 12.0–16.0)
MCH: 25.5 pg — ABNORMAL LOW (ref 26.0–34.0)
MCHC: 31.9 g/dL — ABNORMAL LOW (ref 32.0–36.0)
MCV: 80.1 fL (ref 80.0–100.0)
Platelets: 338 10*3/uL (ref 150–440)
RBC: 4.18 MIL/uL (ref 3.80–5.20)
RDW: 16.1 % — AB (ref 11.5–14.5)
WBC: 14 10*3/uL — AB (ref 3.6–11.0)

## 2015-07-04 LAB — CHLAMYDIA/NGC RT PCR (ARMC ONLY)
Chlamydia Tr: NOT DETECTED
N gonorrhoeae: NOT DETECTED

## 2015-07-04 MED ORDER — OXYCODONE-ACETAMINOPHEN 5-325 MG PO TABS: 1.0000 | ORAL_TABLET | ORAL | Status: DC | PRN

## 2015-07-04 MED ORDER — ONDANSETRON HCL 4 MG PO TABS: 4.0000 mg | ORAL_TABLET | ORAL | Status: DC | PRN

## 2015-07-04 MED ORDER — LACTATED RINGERS IV SOLN: INTRAVENOUS | Status: DC

## 2015-07-04 MED ORDER — SODIUM CHLORIDE 0.9 % IV SOLN
2.0000 g | Freq: Once | INTRAVENOUS | Status: AC
  Administered 2015-07-04: 2 g via INTRAVENOUS

## 2015-07-04 MED ORDER — AMMONIA AROMATIC IN INHA: 0.3000 mL | Freq: Once | RESPIRATORY_TRACT | Status: DC | PRN

## 2015-07-04 MED ORDER — LIDOCAINE HCL (PF) 1 % IJ SOLN
30.0000 mL | INTRAMUSCULAR | Status: DC | PRN
  Filled 2015-07-04: qty 30

## 2015-07-04 MED ORDER — SODIUM CHLORIDE 0.9 % IV SOLN: 2.0000 g | Freq: Once | INTRAVENOUS | Status: DC

## 2015-07-04 MED ORDER — OXYTOCIN 40 UNITS IN LACTATED RINGERS INFUSION - SIMPLE MED
62.5000 mL/h | INTRAVENOUS | Status: DC
  Administered 2015-07-04: 62.5 mL/h via INTRAVENOUS

## 2015-07-04 MED ORDER — DIBUCAINE 1 % RE OINT: 1.0000 "application " | TOPICAL_OINTMENT | RECTAL | Status: DC | PRN

## 2015-07-04 MED ORDER — MISOPROSTOL 200 MCG PO TABS
ORAL_TABLET | ORAL | Status: AC
  Filled 2015-07-04: qty 4

## 2015-07-04 MED ORDER — OXYTOCIN 10 UNIT/ML IJ SOLN
INTRAMUSCULAR | Status: AC
  Filled 2015-07-04: qty 2

## 2015-07-04 MED ORDER — FERROUS SULFATE 325 (65 FE) MG PO TABS
325.0000 mg | ORAL_TABLET | Freq: Every day | ORAL | Status: DC
  Administered 2015-07-05 – 2015-07-06 (×2): 325 mg via ORAL
  Filled 2015-07-04 (×2): qty 1

## 2015-07-04 MED ORDER — HYDROCODONE-ACETAMINOPHEN 5-325 MG PO TABS
1.0000 | ORAL_TABLET | ORAL | Status: DC | PRN
  Administered 2015-07-05 – 2015-07-06 (×3): 2 via ORAL
  Filled 2015-07-04 (×2): qty 2

## 2015-07-04 MED ORDER — WITCH HAZEL-GLYCERIN EX PADS: 1.0000 "application " | MEDICATED_PAD | CUTANEOUS | Status: DC | PRN

## 2015-07-04 MED ORDER — DIPHENHYDRAMINE HCL 25 MG PO CAPS
25.0000 mg | ORAL_CAPSULE | Freq: Four times a day (QID) | ORAL | Status: DC | PRN
  Administered 2015-07-04: 25 mg via ORAL
  Filled 2015-07-04: qty 1

## 2015-07-04 MED ORDER — ONDANSETRON HCL 4 MG/2ML IJ SOLN
4.0000 mg | INTRAMUSCULAR | Status: DC | PRN
Start: 2015-07-04 — End: 2015-07-05

## 2015-07-04 MED ORDER — AMPICILLIN SODIUM 2 G IJ SOLR
2.0000 g | INTRAMUSCULAR | Status: DC
  Filled 2015-07-04 (×7): qty 2000

## 2015-07-04 MED ORDER — BUTORPHANOL TARTRATE 1 MG/ML IJ SOLN
INTRAMUSCULAR | Status: AC
  Administered 2015-07-04: 1 mg via INTRAVENOUS
  Filled 2015-07-04: qty 1

## 2015-07-04 MED ORDER — OXYTOCIN 40 UNITS IN LACTATED RINGERS INFUSION - SIMPLE MED: 62.5000 mL/h | INTRAVENOUS | Status: DC | PRN

## 2015-07-04 MED ORDER — OXYTOCIN BOLUS FROM INFUSION
500.0000 mL | INTRAVENOUS | Status: DC
  Administered 2015-07-04: 500 mL via INTRAVENOUS

## 2015-07-04 MED ORDER — LACTATED RINGERS IV SOLN: 500.0000 mL | INTRAVENOUS | Status: DC | PRN

## 2015-07-04 MED ORDER — SODIUM CHLORIDE 0.9 % IV SOLN
INTRAVENOUS | Status: AC
  Administered 2015-07-04: 2 g via INTRAVENOUS
  Filled 2015-07-04: qty 2000

## 2015-07-04 MED ORDER — AMMONIA AROMATIC IN INHA
RESPIRATORY_TRACT | Status: DC
Start: 2015-07-04 — End: 2015-07-04
  Filled 2015-07-04: qty 10

## 2015-07-04 MED ORDER — BENZOCAINE-MENTHOL 20-0.5 % EX AERO
1.0000 "application " | INHALATION_SPRAY | CUTANEOUS | Status: DC | PRN
  Filled 2015-07-04: qty 56

## 2015-07-04 MED ORDER — OXYCODONE-ACETAMINOPHEN 5-325 MG PO TABS
2.0000 | ORAL_TABLET | ORAL | Status: DC | PRN
  Administered 2015-07-04 (×3): 2 via ORAL
  Filled 2015-07-04 (×2): qty 2

## 2015-07-04 MED ORDER — LANOLIN HYDROUS EX OINT: TOPICAL_OINTMENT | CUTANEOUS | Status: DC | PRN

## 2015-07-04 MED ORDER — BUTORPHANOL TARTRATE 1 MG/ML IJ SOLN
2.0000 mg | Freq: Once | INTRAMUSCULAR | Status: AC
  Administered 2015-07-04: 1 mg via INTRAVENOUS

## 2015-07-04 MED ORDER — SIMETHICONE 80 MG PO CHEW: 80.0000 mg | CHEWABLE_TABLET | ORAL | Status: DC | PRN

## 2015-07-04 MED ORDER — LIDOCAINE HCL (PF) 1 % IJ SOLN
INTRAMUSCULAR | Status: AC
  Filled 2015-07-04: qty 30

## 2015-07-04 MED ORDER — OXYTOCIN 40 UNITS IN LACTATED RINGERS INFUSION - SIMPLE MED
INTRAVENOUS | Status: AC
  Administered 2015-07-04: 62.5 mL/h via INTRAVENOUS
  Filled 2015-07-04: qty 1000

## 2015-07-04 MED ORDER — ONDANSETRON HCL 4 MG/2ML IJ SOLN: 4.0000 mg | Freq: Four times a day (QID) | INTRAMUSCULAR | Status: DC | PRN

## 2015-07-04 MED ORDER — DOCUSATE SODIUM 100 MG PO CAPS
100.0000 mg | ORAL_CAPSULE | Freq: Every day | ORAL | Status: DC
  Administered 2015-07-05 – 2015-07-06 (×3): 100 mg via ORAL
  Filled 2015-07-04 (×3): qty 1

## 2015-07-04 MED ORDER — OXYCODONE-ACETAMINOPHEN 5-325 MG PO TABS
ORAL_TABLET | ORAL | Status: AC
  Administered 2015-07-04: 2 via ORAL
  Filled 2015-07-04: qty 2

## 2015-07-04 MED ORDER — PRENATAL MULTIVITAMIN CH
1.0000 | ORAL_TABLET | Freq: Every day | ORAL | Status: DC
  Administered 2015-07-05: 1 via ORAL
  Filled 2015-07-04: qty 1

## 2015-07-04 MED ORDER — MISOPROSTOL 200 MCG PO TABS
800.0000 ug | ORAL_TABLET | Freq: Once | ORAL | Status: DC | PRN
  Filled 2015-07-04: qty 4

## 2015-07-04 MED ORDER — IBUPROFEN 600 MG PO TABS
600.0000 mg | ORAL_TABLET | Freq: Four times a day (QID) | ORAL | Status: DC
  Administered 2015-07-04 – 2015-07-06 (×7): 600 mg via ORAL
  Filled 2015-07-04 (×7): qty 1

## 2015-07-04 NOTE — Discharge Summary (Signed)
Physician Obstetric Discharge Summary  Patient ID: Mary Terry MRN: [id number] DOB/AGE: 02/14/1991 24 y.o.   Date of Admission:07/04/2015 Date of Discharge: 07/06/2015  Admitting Diagnosis: Onset of Labor at [redacted]w[redacted]d  Secondary Diagnosis: Marijuana use in pregnancy/ Group B strep positive  Mode of Delivery: normal spontaneous vaginal delivery     Discharge Diagnosis: Term intrauterine pregnancy-delivered   Intrapartum Procedures: Atificial rupture of membranes   Post partum procedures: None  Complications: none   Brief Hospital Course  Mary Terry is a [id number] who had a SVD on 07/04/2015;  for further details of this delivery, please refer to the delivery note.  Patient had an uncomplicated postpartum course.  By time of discharge on PPD#2, her pain was controlled on oral pain medications; she had appropriate lochia and was ambulating, voiding without difficulty and tolerating regular diet.  She was deemed stable for discharge to home.   Labs: CBC Latest Ref Rng 07/05/2015 07/04/2015 06/27/2015  WBC 3.6 - 11.0 K/uL 16.3(H) 14.0(H) 12.1(H)  Hemoglobin 12.0 - 16.0 g/dL 1.4(N) 10.7(L) 10.4(L)  Hematocrit 35.0 - 47.0 % 29.3(L) 33.4(L) 32.2(L)  Platelets 150 - 440 K/uL 299 338 305   O POS  Physical exam:  Blood pressure 133/78, pulse 58, temperature 98.6 F (37 C), temperature source Oral, resp. rate 18, SpO2 99 %, unknown if currently breastfeeding. General: alert and no distress Lochia: appropriate Abdomen: soft, NT Uterine Fundus: firm at U-1/NT/ sl deviated to right Extremities: No evidence of DVT seen on physical exam. No lower extremity edema.  Discharge Instructions: Per After Visit Summary. Activity: Advance as tolerated. Pelvic rest for 6 weeks.  Also refer to Discharge Instructions Diet: Regular Medications:   Medication List    STOP taking these medications        neomycin-bacitracin-polymyxin ointment  Commonly known as:  NEOSPORIN     valACYclovir 1000  MG tablet  Commonly known as:  VALTREX      TAKE these medications        benzocaine-Menthol 20-0.5 % Aero  Commonly known as:  DERMOPLAST  Apply 1 application topically as needed for irritation (perineal discomfort).     docusate sodium 100 MG capsule  Commonly known as:  COLACE  Take 1 capsule (100 mg total) by mouth 2 (two) times daily as needed for mild constipation.     ferrous sulfate 325 (65 FE) MG tablet  Take 1 tablet (325 mg total) by mouth daily with breakfast.     HYDROcodone-acetaminophen 5-325 MG per tablet  Commonly known as:  NORCO/VICODIN  Take 1 tablet by mouth every 4 (four) hours as needed for moderate pain.     ibuprofen 600 MG tablet  Commonly known as:  ADVIL,MOTRIN  Take 1 tablet (600 mg total) by mouth every 6 (six) hours as needed.     multivitamin-prenatal 27-0.8 MG Tabs tablet  Take 1 tablet by mouth daily at 12 noon.       Outpatient follow up:  Follow-up Information    Follow up with Irish Breisch, CNM. Call in 6 weeks.   Specialty:  Certified Nurse Midwife   Why:  for postpartum visit and for Nexplanon insertion   Contact information:   [address] [place] [address] [phone]      Postpartum contraception: Nexplanon  Discharged Condition: stable  Discharged to: home   Newborn Data: Disposition:home with mother/ Kahlia  Apgars: APGAR (1 MIN): 8   APGAR (5 MINS): 9   APGAR (10 MINS):    Baby  Feeding: Breast and bottle  Marchell Froman, CNM 07/06/2015 8:51 AM

## 2015-07-04 NOTE — H&P (Signed)
OB History & Physical   History of Present Illness:  Chief Complaint:  Complains of onset contractions at 0200. No VB or LOF. Feeling pushy. HPI:  Mary Terry is a 24 y.o. G74P2002 female with EDC=07/05/2015  at [redacted]w[redacted]d dated by her LMP and c/b an 11 week ultrasound.  Her pregnancy has been complicated by substance use (marijuana), smoking, close interconceptual spacing, a false positive RPR, and HSVII (Valtrex PPX)..  She presents to L&D for evaluation of onset of labor. Baby has been active.   Prenatal care site: Prenatal care at [facility] . History of SVD x2. Pelvis proven to 7#2 oz. Had postpartum preeclampsia after last delivery requiring readmission and magnesium.      Maternal Medical History:   Past Medical History  Diagnosis Date  . Herpes genitalis   . Hx of preeclampsia, prior pregnancy, currently pregnant   . History of substance abuse     Past Surgical History  Procedure Laterality Date  . No past surgeries      No Known Allergies  Prior to Admission medications   Medication Sig Start Date End Date Taking? Authorizing Provider  neomycin-bacitracin-polymyxin (NEOSPORIN) ointment Apply 1 application topically as needed for wound care. apply to eye    Historical Provider, MD  Prenatal Vit-Fe Fumarate-FA (MULTIVITAMIN-PRENATAL) 27-0.8 MG TABS tablet Take 1 tablet by mouth daily at 12 noon.    Historical Provider, MD  valACYclovir (VALTREX) 1000 MG tablet Take 500 mg by mouth daily. 04/27/15   Historical Provider, MD          Social History: She  reports that she has been smoking Cigarettes.  She has a .75 pack-year smoking history. She has never used smokeless tobacco. She reports that she uses illicit drugs (Marijuana). She reports that she does not drink alcohol.  Family History: Family history is unknown by patient.   Review of Systems: Negative x 10 systems reviewed except as noted in the HPI.      Physical Exam:  Vital Signs: BP 128/85 mmHg  Pulse  78 General: breathing and bearing down with contractions  HEENT: normocephalic, atraumatic Heart: regular rate & rhythm.  No murmurs Lungs: clear to auscultation bilaterally Abdomen: soft, gravid, non-tender;  EFW:7 1/2# Pelvic:   External: Normal external female genitalia  Cervix: Dilation: 5 / Effacement (%): 100 / Station: -1 /vtx on [place]   Extremities: non-tender, symmetric, trace nonpitting edema bilaterally.  DTRs: +3 Neurologic: Alert & oriented x 3.    Pertinent Results:  Prenatal Labs: Blood type/Rh O positive  Antibody screen negative  Rubella Varicella Immune Immune  RPR Positive: !:4 and 1:1 with negative TP  HBsAg Negative  HIV negative  GC negative  Chlamydia negative  Genetic screening Negative first trimester test  1 hour GTT 94  3 hour GTT N/A  GBS positive on 8/1  FHR 130s with accelerations to 150s to 170 with moderate variability. Initially with some early decelerations. Toco: q67min contractions   Assessment:  Mary Terry is a 24 y.o. G60P2002 female at [redacted]w[redacted]d with labor   Plan:  1. Admit to Labor & Delivery -  2. CBC, T&S, Clrs, IVF 3. GBS positive and ampicillin begun.   4. Consents obtained. 5. Stadol for pain, epidural if desires 6. UDS  [name]  07/04/2015 7:19 AM

## 2015-07-05 LAB — RPR, QUANT+TP ABS (REFLEX): T Pallidum Abs: NEGATIVE

## 2015-07-05 LAB — CBC
HCT: 29.3 % — ABNORMAL LOW (ref 35.0–47.0)
HEMOGLOBIN: 9.4 g/dL — AB (ref 12.0–16.0)
MCH: 25.9 pg — AB (ref 26.0–34.0)
MCHC: 32.1 g/dL (ref 32.0–36.0)
MCV: 80.7 fL (ref 80.0–100.0)
Platelets: 299 10*3/uL (ref 150–440)
RBC: 3.63 MIL/uL — AB (ref 3.80–5.20)
RDW: 16.6 % — ABNORMAL HIGH (ref 11.5–14.5)
WBC: 16.3 10*3/uL — AB (ref 3.6–11.0)

## 2015-07-05 LAB — RPR: RPR: REACTIVE — AB

## 2015-07-05 MED ORDER — MAGNESIUM HYDROXIDE 400 MG/5ML PO SUSP
30.0000 mL | Freq: Every day | ORAL | Status: DC
Start: 2015-07-05 — End: 2015-07-06
  Administered 2015-07-05: 30 mL via ORAL
  Filled 2015-07-05 (×2): qty 30

## 2015-07-05 NOTE — Progress Notes (Signed)
  Postpartum Day 1  Subjective: no complaints, up ad lib and voiding  Objective: Blood pressure 115/64, pulse 80, temperature 98.5 F (36.9 C), temperature source Oral, resp. rate 20, SpO2 100 %, unknown if currently breastfeeding.  Physical Exam:  General: alert and cooperative Lochia: appropriate Uterine Fundus: firm Incision: N/A DVT Evaluation: No evidence of DVT seen on physical exam. Abdomen: soft, NT   Recent Labs  07/04/15 0707 07/05/15 0624  HGB 10.7* 9.4*  HCT 33.4* 29.3*    Assessment PPD #1, mild blood loss anemia  Plan: Continue PP care and Fe replacement, anemia precautions  Hold discharge until tomorrow given inadequate treatment for GBS and pt's h/o PP pre-eclampsia  Feeding: bottle Contraception: Nexplanon (already ordered at office) RI Otho Darner  TDAP UTD Blood Type: O+   Marta Antu, CNM 07/05/2015, 9:20 AM

## 2015-07-06 MED ORDER — BENZOCAINE-MENTHOL 20-0.5 % EX AERO: 1.0000 "application " | INHALATION_SPRAY | CUTANEOUS | Status: DC | PRN

## 2015-07-06 MED ORDER — HYDROCODONE-ACETAMINOPHEN 5-325 MG PO TABS: 1.0000 | ORAL_TABLET | ORAL | Status: DC | PRN

## 2015-07-06 MED ORDER — DOCUSATE SODIUM 100 MG PO CAPS: 100.0000 mg | ORAL_CAPSULE | Freq: Two times a day (BID) | ORAL | Status: DC | PRN

## 2015-07-06 MED ORDER — FERROUS SULFATE 325 (65 FE) MG PO TABS: 325.0000 mg | ORAL_TABLET | Freq: Every day | ORAL | Status: DC

## 2015-07-06 MED ORDER — IBUPROFEN 600 MG PO TABS: 600.0000 mg | ORAL_TABLET | Freq: Four times a day (QID) | ORAL | Status: DC | PRN

## 2015-07-06 NOTE — Discharge Instructions (Signed)
Vaginal Delivery, Care After Refer to this sheet in the next few weeks. These discharge instructions provide you with information on caring for yourself after delivery. Your caregiver may also give you specific instructions. Your treatment has been planned according to the most current medical practices available, but problems sometimes occur. Call your caregiver if you have any problems or questions after you go home. HOME CARE INSTRUCTIONS 1. Take over-the-counter or prescription medicines only as directed by your caregiver or pharmacist. 2. Do not drink alcohol, especially if you are breastfeeding or taking medicine to relieve pain. 3. Do not smoke tobacco. 4. Continue to use good perineal care. Good perineal care includes: 1. Wiping your perineum from back to front 2. Keeping your perineum clean. 3. You can do sitz baths twice a day, to help keep this area clean 5. Do not use tampons, douche or have sex until your caregiver says it is okay. 6. Shower only and avoid sitting in submerged water, aside from sitz baths 7. Wear a well-fitting bra that provides breast support. 8. Eat healthy foods. 9. Drink enough fluids to keep your urine clear or pale yellow. 10. Eat high-fiber foods such as whole grain cereals and breads, brown rice, beans, and fresh fruits and vegetables every day. These foods may help prevent or relieve constipation. 11. Avoid constipation with high fiber foods or medications, such as miralax or metamucil 12. Follow your caregiver's recommendations regarding resumption of activities such as climbing stairs, driving, lifting, exercising, or traveling. 13. Talk to your caregiver about resuming sexual activities. Resumption of sexual activities is dependent upon your risk of infection, your rate of healing, and your comfort and desire to resume sexual activity. 14. Try to have someone help you with your household activities and your newborn for at least a few days after you leave  the hospital. 15. Rest as much as possible. Try to rest or take a nap when your newborn is sleeping. 16. Increase your activities gradually. 17. Keep all of your scheduled postpartum appointments. It is very important to keep your scheduled follow-up appointments. At these appointments, your caregiver will be checking to make sure that you are healing physically and emotionally. SEEK MEDICAL CARE IF:   You are passing large clots from your vagina. Save any clots to show your caregiver.  You have a foul smelling discharge from your vagina.  You have trouble urinating.  You are urinating frequently.  You have pain when you urinate.  You have a change in your bowel movements.  You have increasing redness, pain, or swelling near your vaginal incision (episiotomy) or vaginal tear.  You have pus draining from your episiotomy or vaginal tear.  Your episiotomy or vaginal tear is separating.  You have painful, hard, or reddened breasts.  You have a severe headache.  You have blurred vision or see spots.  You feel sad or depressed.  You have thoughts of hurting yourself or your newborn.  You have questions about your care, the care of your newborn, or medicines.  You are dizzy or light-headed.  You have a rash.  You have nausea or vomiting.  You were breastfeeding and have not had a menstrual period within 12 weeks after you stopped breastfeeding.  You are not breastfeeding and have not had a menstrual period by the 12th week after delivery.  You have a fever. SEEK IMMEDIATE MEDICAL CARE IF:   You have persistent pain.  You have chest pain.  You have shortness of breath.    You faint.  You have leg pain.  You have stomach pain.  Your vaginal bleeding saturates two or more sanitary pads in 1 hour. MAKE SURE YOU:   Understand these instructions.  Will watch your condition.  Will get help right away if you are not doing well or get worse. Document Released:  10/20/2000 Document Revised: 03/09/2014 Document Reviewed: 06/19/2012 ExitCare Patient Information 2015 [employer]. This information is not intended to replace advice given to you by your health care provider. Make sure you discuss any questions you have with your health care provider.  Sitz Bath A sitz bath is a warm water bath taken in the sitting position. The water covers only the hips and butt (buttocks). We recommend using one that fits in the toilet, to help with ease of use and cleanliness. It may be used for either healing or cleaning purposes. Sitz baths are also used to relieve pain, itching, or muscle tightening (spasms). The water may contain medicine. Moist heat will help you heal and relax.  HOME CARE  Take 3 to 4 sitz baths a day. 18. Fill the bathtub half-full with warm water. 19. Sit in the water and open the drain a little. 20. Turn on the warm water to keep the tub half-full. Keep the water running constantly. 21. Soak in the water for 15 to 20 minutes. 22. After the sitz bath, pat the affected area dry. GET HELP RIGHT AWAY IF: You get worse instead of better. Stop the sitz baths if you get worse. MAKE SURE YOU:  Understand these instructions.  Will watch your condition.  Will get help right away if you are not doing well or get worse. Document Released: 11/30/2004 Document Revised: 07/17/2012 Document Reviewed: 02/20/2011 ExitCare Patient Information 2015 [employer]. This information is not intended to replace advice given to you by your health care provider. Make sure you discuss any questions you have with your health care provider.    

## 2015-07-06 NOTE — Progress Notes (Signed)
Patient given discharge instructions and verbalized understanding of self-care and care of baby when returned to home. Follow up instructions given, and patient will be taken down by volunteer services. 

## 2015-07-06 NOTE — Progress Notes (Signed)
Patient given discharge instructions and verbalized understanding of self-care and care of baby when returned to home. Follow up instructions given, and patient will be taken down by volunteer services.
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
## 2015-07-08 ENCOUNTER — Emergency Department: Payer: Medicaid Other

## 2015-07-08 ENCOUNTER — Encounter: Payer: Self-pay | Admitting: Emergency Medicine

## 2015-07-08 ENCOUNTER — Inpatient Hospital Stay
Admission: EM | Admit: 2015-07-08 | Discharge: 2015-07-13 | DRG: 776 | Disposition: A | Discharge status: Home / Self Care | Payer: Medicaid Other | Attending: Obstetrics & Gynecology | Admitting: Obstetrics & Gynecology

## 2015-07-08 DIAGNOSIS — Z3A00 Weeks of gestation of pregnancy not specified: Secondary | ICD-10-CM | POA: Diagnosis present

## 2015-07-08 DIAGNOSIS — O99345 Other mental disorders complicating the puerperium: Secondary | ICD-10-CM | POA: Diagnosis present

## 2015-07-08 DIAGNOSIS — O1093 Unspecified pre-existing hypertension complicating the puerperium: Secondary | ICD-10-CM | POA: Diagnosis present

## 2015-07-08 DIAGNOSIS — O1495 Unspecified pre-eclampsia, complicating the puerperium: Secondary | ICD-10-CM

## 2015-07-08 DIAGNOSIS — O99335 Smoking (tobacco) complicating the puerperium: Secondary | ICD-10-CM | POA: Diagnosis present

## 2015-07-08 DIAGNOSIS — O1490 Unspecified pre-eclampsia, unspecified trimester: Secondary | ICD-10-CM | POA: Diagnosis present

## 2015-07-08 DIAGNOSIS — A6009 Herpesviral infection of other urogenital tract: Secondary | ICD-10-CM | POA: Diagnosis present

## 2015-07-08 DIAGNOSIS — Z87898 Personal history of other specified conditions: Secondary | ICD-10-CM | POA: Diagnosis not present

## 2015-07-08 DIAGNOSIS — O9943 Diseases of the circulatory system complicating the puerperium: Secondary | ICD-10-CM

## 2015-07-08 DIAGNOSIS — N289 Disorder of kidney and ureter, unspecified: Secondary | ICD-10-CM | POA: Diagnosis present

## 2015-07-08 DIAGNOSIS — R4781 Slurred speech: Secondary | ICD-10-CM | POA: Diagnosis present

## 2015-07-08 DIAGNOSIS — I517 Cardiomegaly: Secondary | ICD-10-CM | POA: Clinically undetermined

## 2015-07-08 DIAGNOSIS — I10 Essential (primary) hypertension: Secondary | ICD-10-CM

## 2015-07-08 DIAGNOSIS — F419 Anxiety disorder, unspecified: Secondary | ICD-10-CM | POA: Diagnosis present

## 2015-07-08 DIAGNOSIS — O99355 Diseases of the nervous system complicating the puerperium: Principal | ICD-10-CM | POA: Diagnosis present

## 2015-07-08 DIAGNOSIS — R079 Chest pain, unspecified: Secondary | ICD-10-CM

## 2015-07-08 DIAGNOSIS — R1011 Right upper quadrant pain: Secondary | ICD-10-CM

## 2015-07-08 DIAGNOSIS — I6783 Posterior reversible encephalopathy syndrome: Secondary | ICD-10-CM

## 2015-07-08 DIAGNOSIS — O164 Unspecified maternal hypertension, complicating childbirth: Secondary | ICD-10-CM

## 2015-07-08 DIAGNOSIS — I2699 Other pulmonary embolism without acute cor pulmonale: Secondary | ICD-10-CM

## 2015-07-08 LAB — COMPREHENSIVE METABOLIC PANEL
ALBUMIN: 3 g/dL — AB (ref 3.5–5.0)
ALT: 35 U/L (ref 14–54)
ANION GAP: 9 (ref 5–15)
AST: 37 U/L (ref 15–41)
Alkaline Phosphatase: 159 U/L — ABNORMAL HIGH (ref 38–126)
BUN: 13 mg/dL (ref 6–20)
CHLORIDE: 109 mmol/L (ref 101–111)
CO2: 24 mmol/L (ref 22–32)
Calcium: 8.7 mg/dL — ABNORMAL LOW (ref 8.9–10.3)
Creatinine, Ser: 0.87 mg/dL (ref 0.44–1.00)
GFR calc Af Amer: >60 mL/min (ref >60)
GFR calc non Af Amer: >60 mL/min (ref >60)
GLUCOSE: 68 mg/dL (ref 65–99)
POTASSIUM: 4 mmol/L (ref 3.5–5.1)
SODIUM: 142 mmol/L (ref 135–145)
Total Bilirubin: 0.2 mg/dL — ABNORMAL LOW (ref 0.3–1.2)
Total Protein: 7.1 g/dL (ref 6.5–8.1)

## 2015-07-08 LAB — CBC WITH DIFFERENTIAL/PLATELET
BASOS ABS: 0.1 10*3/uL (ref 0–0.1)
BASOS PCT: 1 %
EOS ABS: 0.3 10*3/uL (ref 0–0.7)
EOS PCT: 3 %
HCT: 35.3 % (ref 35.0–47.0)
Hemoglobin: 11.3 g/dL — ABNORMAL LOW (ref 12.0–16.0)
Lymphocytes Relative: 23 %
Lymphs Abs: 2.8 10*3/uL (ref 1.0–3.6)
MCH: 25.9 pg — ABNORMAL LOW (ref 26.0–34.0)
MCHC: 32 g/dL (ref 32.0–36.0)
MCV: 81 fL (ref 80.0–100.0)
MONO ABS: 1 10*3/uL — AB (ref 0.2–0.9)
Monocytes Relative: 9 %
NEUTROS ABS: 7.6 10*3/uL — AB (ref 1.4–6.5)
Neutrophils Relative %: 64 %
PLATELETS: 400 10*3/uL (ref 150–440)
RBC: 4.36 MIL/uL (ref 3.80–5.20)
RDW: 16.9 % — AB (ref 11.5–14.5)
WBC: 11.8 10*3/uL — ABNORMAL HIGH (ref 3.6–11.0)

## 2015-07-08 LAB — URINALYSIS COMPLETE WITH MICROSCOPIC (ARMC ONLY)
BACTERIA UA: NONE SEEN
BILIRUBIN URINE: NEGATIVE
GLUCOSE, UA: NEGATIVE mg/dL
Ketones, ur: NEGATIVE mg/dL
Nitrite: NEGATIVE
Protein, ur: 100 mg/dL — AB
SPECIFIC GRAVITY, URINE: 1.028 (ref 1.005–1.030)
pH: 5 (ref 5.0–8.0)

## 2015-07-08 LAB — APTT: APTT: 35 s (ref 24–36)

## 2015-07-08 MED ORDER — LABETALOL HCL 5 MG/ML IV SOLN
40.0000 mg | Freq: Once | INTRAVENOUS | Status: AC
Start: 2015-07-08 — End: 2015-07-08
  Administered 2015-07-08: 40 mg via INTRAVENOUS
  Filled 2015-07-08: qty 8

## 2015-07-08 MED ORDER — MAGNESIUM SULFATE 50 % IJ SOLN
2.0000 g/h | INTRAMUSCULAR | Status: DC
  Administered 2015-07-08: 2 g/h via INTRAVENOUS
  Filled 2015-07-08: qty 80

## 2015-07-08 MED ORDER — NIFEDIPINE ER 30 MG PO TB24
30.0000 mg | ORAL_TABLET | Freq: Once | ORAL | Status: AC
  Administered 2015-07-08: 30 mg via ORAL
  Filled 2015-07-08: qty 1

## 2015-07-08 MED ORDER — CALCIUM GLUCONATE 10 % IV SOLN
INTRAVENOUS | Status: AC
  Filled 2015-07-08: qty 10

## 2015-07-08 MED ORDER — HYDRALAZINE HCL 20 MG/ML IJ SOLN: 10.0000 mg | Freq: Once | INTRAMUSCULAR | Status: DC | PRN

## 2015-07-08 MED ORDER — MAGNESIUM SULFATE 4 GM/100ML IV SOLN
4.0000 g | Freq: Once | INTRAVENOUS | Status: AC
  Administered 2015-07-08: 4 g via INTRAVENOUS
  Filled 2015-07-08: qty 100

## 2015-07-08 MED ORDER — LABETALOL HCL 5 MG/ML IV SOLN
20.0000 mg | Freq: Once | INTRAVENOUS | Status: AC
  Administered 2015-07-08: 20 mg via INTRAVENOUS
  Filled 2015-07-08: qty 4

## 2015-07-08 MED ORDER — SODIUM CHLORIDE 0.9 % IJ SOLN
INTRAMUSCULAR | Status: AC
  Filled 2015-07-08: qty 3

## 2015-07-08 MED ORDER — SODIUM CHLORIDE 0.9 % IV SOLN
Freq: Once | INTRAVENOUS | Status: AC
  Administered 2015-07-08: 18:00:00 via INTRAVENOUS

## 2015-07-08 MED ORDER — IBUPROFEN 600 MG PO TABS
ORAL_TABLET | ORAL | Status: AC
  Administered 2015-07-08: 600 mg
  Filled 2015-07-08: qty 1

## 2015-07-08 MED ORDER — LABETALOL HCL 5 MG/ML IV SOLN
20.0000 mg | INTRAVENOUS | Status: DC | PRN
  Filled 2015-07-08: qty 4

## 2015-07-08 NOTE — ED Notes (Signed)
Patient presents to the ED with severe headache, slurred speech, anxiety and hypertension.  Patient reports she had a normal vaginal delivery 4 days ago.  Patient states after her last child she had pre-eclampsia.

## 2015-07-08 NOTE — ED Notes (Addendum)
OB MD was at bedside with pt. Pt now taken to ultrasound.

## 2015-07-08 NOTE — ED Notes (Signed)
Pt appears to be less anxious about feelings documented earlier. Pt resting in bed with newborn on chest. Lights dimmed and no acute distress is noted.

## 2015-07-08 NOTE — ED Notes (Addendum)
Spoke with Consulting civil engineer on L&D; pt assigned to LDR Rm 5. Nursing supervisor to be made aware.

## 2015-07-08 NOTE — H&P (Signed)
Consult History and Physical   SERVICE: Obstetrics and Gynecology  [name] Patient MRN:   [id number]  CC: elevated BPs, HA, change of vision, epigastric pain  HPI: Mary Terry is a 24 y.o. G3P3003 who is s/p a SVD on 07/04/2015 and discharged on 07/06/2015 without complication.  She has a history of post partum pre-eclampsia requiring readmission and seizure ppx with magnesium sulfate with her last delivery.  She presented to the ED with complaints of severe frontal HA and change of vision, as well as epigastric pain, and was found to have blood pressures in the 180s/110s.  She denies shortness of breath except when lying down.  I was called with report that Magnesium sulfate had been ordered, but no anti-hypertensives.  ED course: I verbally ordered 20 of Labetalol IV push to be given immediately, concurrent with PO nifedipine 30mg  XR.  Asked for stat RUQ ultrasound to eval for liver hematoma, CMP, CBC, coags.  When I arrived to the ED room, the labetalol had been given, and repeat BP was 162/113. She was quickly taken to ultrasound where there were no findings concerning for liver disruption. I was present for some of the ultrasound.  Following this her BP was still >160 and 40 Labetalol was given.  Magnesium sulfate bolus was also initiated.    Review of Systems: positives in bold GEN:   fevers, chills, weight changes, appetite changes, fatigue, night sweats HEENT:  HA, vision changes, hearing loss, congestion, rhinorrhea, sinus pressure, dysphagia CV:   CP, palpitations PULM:  SOB, cough GI:  abd pain, N/V/D/C, epigastric pain GU:  dysuria, urgency, frequency MSK:  arthralgias, myalgias, back pain, swelling SKIN:  rashes, color changes, pallor NEURO:  numbness, weakness, tingling, seizures, dizziness, tremors PSYCH:  depression, anxiety, behavioral problems, confusion  HEME/LYMPH:  easy bruising or bleeding ENDO:  heat/cold intolerance Breast tenderness and  engorgement    Past Obstetrical History: OB History    Gravida Para Term Preterm AB TAB SAB Ectopic Multiple Living   3 3 3  0 0 0 0 0 0 3         Past Medical History: Past Medical History  Diagnosis Date  . Herpes genitalis   . Hx of preeclampsia, prior pregnancy, currently pregnant   . History of substance abuse     Past Surgical History:   Past Surgical History  Procedure Laterality Date  . No past surgeries      Family History:  Family history is unknown by patient.  Social History:  Social History   Social History  . Marital Status: Divorced    Spouse Name: N/A  . Number of Children: N/A  . Years of Education: N/A   Occupational History  . Not on file.   Social History Main Topics  . Smoking status: Current Every Day Smoker -- 0.25 packs/day for 3 years    Types: Cigarettes  . Smokeless tobacco: Never Used  . Alcohol Use: No  . Drug Use: Yes    Special: Marijuana  . Sexual Activity: Yes   Other Topics Concern  . Not on file   Social History Narrative    Home Medications:  Medications reconciled in EPIC  No current facility-administered medications on file prior to encounter.   Current Outpatient Prescriptions on File Prior to Encounter  Medication Sig Dispense Refill  . HYDROcodone-acetaminophen (NORCO/VICODIN) 5-325 MG per tablet Take 1 tablet by mouth every 4 (four) hours as needed for moderate pain. 20 tablet 0  .  ibuprofen (ADVIL,MOTRIN) 600 MG tablet Take 1 tablet (600 mg total) by mouth every 6 (six) hours as needed. 50 tablet 0  . benzocaine-Menthol (DERMOPLAST) 20-0.5 % AERO Apply 1 application topically as needed for irritation (perineal discomfort).    . docusate sodium (COLACE) 100 MG capsule Take 1 capsule (100 mg total) by mouth 2 (two) times daily as needed for mild constipation. 60 capsule 0  . ferrous sulfate 325 (65 FE) MG tablet Take 1 tablet (325 mg total) by mouth daily with breakfast.  3  . Prenatal Vit-Fe Fumarate-FA  (MULTIVITAMIN-PRENATAL) 27-0.8 MG TABS tablet Take 1 tablet by mouth daily at 12 noon.      Allergies:  Allergies  Allergen Reactions  . Percocet [Oxycodone-Acetaminophen] Itching    Physical Exam:  Temp:  [98.7 F (37.1 C)] 98.7 F (37.1 C) (09/01 1617) Pulse Rate:  [55-68] 65 (09/01 1900) Resp:  [17-18] 17 (09/01 1630) BP: (148-173)/(96-111) 153/104 mmHg (09/01 1900) SpO2:  [96 %-99 %] 99 % (09/01 1900) Weight:  [84.369 kg (186 lb)] 84.369 kg (186 lb) (09/01 1617)   General Appearance:  Well developed, well nourished, no acute distress, alert and oriented x3 HEENT:  Normocephalic atraumatic, extraocular movements intact, moist mucous membranes Cardiovascular:  Normal S1/S2, regular rate and rhythm, no murmurs Pulmonary:  clear to auscultation, no wheezes, rales or rhonchi, symmetric air entry, good air exchange Abdomen:  Bowel sounds present, soft, nontender, nondistended, no abnormal masses, no epigastric pain Extremities:  Full range of motion, no pedal edema, 2+ distal pulses, no tenderness Skin:  normal coloration and turgor, no rashes, no suspicious skin lesions noted  Neurologic:  Cranial nerves 2-12 grossly intact, 3+ DTRs with minimal clonus Psychiatric:  Normal mood and affect, appropriate, no AH/VH Pelvic:  deferred  Labs/Studies:   CBC and Coags:  Lab Results  Component Value Date   WBC 11.8* 07/08/2015   NEUTOPHILPCT 64 07/08/2015   EOSPCT 3 07/08/2015   BASOPCT 1 07/08/2015   LYMPHOPCT 23 07/08/2015   HGB 11.3* 07/08/2015   HCT 35.3 07/08/2015   MCV 81.0 07/08/2015   PLT 400 07/08/2015   CMP:  Lab Results  Component Value Date   NA 136 06/27/2015   K 3.9 06/27/2015   CL 107 06/27/2015   CO2 21* 06/27/2015   BUN <5* 06/27/2015   CREATININE 0.64 06/27/2015   PROT 6.3* 06/27/2015   BILITOT 0.4 06/27/2015   ALT 11* 06/27/2015   AST 16 06/27/2015   ALKPHOS 197* 06/27/2015   Imaging: US Abdomen Limited Ruq  07/08/2015   CLINICAL DATA:  Severe  right upper quadrant pain. Hypertension. Two days post delivery.  EXAM: US ABDOMEN LIMITED - RIGHT UPPER QUADRANT  COMPARISON:  CT, 04/06/2014.  FINDINGS: Gallbladder:  Gallbladder is mildly distended. Wall appears thickened and edematous. No gallstone is seen. Wall measures 4.8 mm. No specific tenderness to transducer pressure over the gallbladder.  Common bile duct:  Diameter: 4.0 mm  Liver:  Liver measures mildly enlarged, measuring 21 cm. Normal liver parenchymal echogenicity. No mass or focal lesion. Hepatopetal flow documented in the portal vein.  IMPRESSION: 1. Gallbladder wall is thickened with some edema, but there is no gallstone and no sonographic Murphy's sign. Wall thickening may be reactive. 2. Liver measures mildly enlarged but is otherwise unremarkable. It did not appear enlarged on the prior CT. No liver mass or focal lesion.   Electronically Signed   By: Amie Portland M.D.   On: 07/08/2015 17:40     Assessment /  Plan:   Mary Terry is a 24 y.o. [id number] who presents with post partum preeclampsia with severe features.  1. Admit to L&D for magnesium sulfate gtt x 24 hours for seizure ppx. 2. Continue to read BPs per preeclampsia protocol  3. Foley catheter for accurate I/O record 4. S/p  then  of Labetalol IV push and 30 Nifedipine XL po.  Will admin either Labetalol or Hydralazine PRN to maintain BPs <160 and <110. 5. Breast pump to bedside 6. Tylenol, Ibuprofen, tramadol for HA although will not likely subside until BPs are better and/or edema of brain subsides.  If no improvement will consider Lasix to decrease third spacing.     ----- Ranae Plumber, MD Attending Obstetrician and Gynecologist [facility]

## 2015-07-08 NOTE — ED Notes (Signed)
Tray ordered for pt through dietary.

## 2015-07-08 NOTE — ED Provider Notes (Signed)
[facility] Emergency Department Provider Note  ____________________________________________  Time seen: Approximately 4:39 PM  I have reviewed the triage vital signs and the nursing notes.   HISTORY  Chief Complaint Hypertension    HPI Mary Terry is a 24 y.o. female who reports onset of right upper quadrant pain 2 days ago that night after she got out of the hospital from delivering her baby. She also again having a severe headache yesterday started mild and got worse. She reports this headache is not the worst of her life she had a worse headache when she delivered her first son. She had preeclampsia at that time. Patient's blood pressure in triage was quite elevated. I believe she is probably having preeclampsia again. Patient has been delivering [place] a urine specimen at the present time I've discussed the patient with OB we will start her on magnesium 4 g IV over 20 minutes OB wanted 30 mg of nifedipine by mouth and 20 mg of labetalol IV this is been ordered for the patient they are awaiting her blood work as well to check for HELLP syndrome.He'll be also requested a right upper quadrant ultrasound which has been ordered   Past Medical History  Diagnosis Date  . Herpes genitalis   . Hx of preeclampsia, prior pregnancy, currently pregnant   . History of substance abuse     Patient Active Problem List   Diagnosis Date Noted  . History of substance abuse 07/04/2015  . Postpartum care following vaginal delivery 07/04/2015    Past Surgical History  Procedure Laterality Date  . No past surgeries      Current Outpatient Rx  Name  Route  Sig  Dispense  Refill  . benzocaine-Menthol (DERMOPLAST) 20-0.5 % AERO   Topical   Apply 1 application topically as needed for irritation (perineal discomfort).         . docusate sodium (COLACE) 100 MG capsule   Oral   Take 1 capsule (100 mg total) by mouth 2 (two) times daily as needed for mild constipation.    60 capsule   0   . ferrous sulfate 325 (65 FE) MG tablet   Oral   Take 1 tablet (325 mg total) by mouth daily with breakfast.      3   . HYDROcodone-acetaminophen (NORCO/VICODIN) 5-325 MG per tablet   Oral   Take 1 tablet by mouth every 4 (four) hours as needed for moderate pain.   20 tablet   0   . ibuprofen (ADVIL,MOTRIN) 600 MG tablet   Oral   Take 1 tablet (600 mg total) by mouth every 6 (six) hours as needed.   50 tablet   0   . Prenatal Vit-Fe Fumarate-FA (MULTIVITAMIN-PRENATAL) 27-0.8 MG TABS tablet   Oral   Take 1 tablet by mouth daily at 12 noon.           Allergies Review of patient's allergies indicates no known allergies.  Family History  Problem Relation Age of Onset  . Family history unknown: Yes    Social History Social History  Substance Use Topics  . Smoking status: Current Every Day Smoker -- 0.25 packs/day for 3 years    Types: Cigarettes  . Smokeless tobacco: Never Used  . Alcohol Use: No    Review of Systems Constitutional: No fever/chills Eyes: No visual changes. ENT: No sore throat. Cardiovascular: Denies chest pain. Respiratory: Denies shortness of breath. Gastrointestinal: See H&P.  No diarrhea.  No constipation. Genitourinary: Negative for  dysuria. Musculoskeletal: Negative for back pain. Skin: Negative for rash. Neurological:  See H&P no focal weakness or numbness. tive.  ____________________________________________   PHYSICAL EXAM:  VITAL SIGNS: ED Triage Vitals  Enc Vitals Group     BP 07/08/15 1617 172/99 mmHg     Pulse Rate 07/08/15 1617 68     Resp 07/08/15 1617 18     Temp 07/08/15 1617 98.7 F (37.1 C)     Temp Source 07/08/15 1617 Oral     SpO2 07/08/15 1617 96 %     Weight 07/08/15 1617 186 lb (84.369 kg)     Height 07/08/15 1617 5\' 4"  (1.626 m)     Head Cir --      Peak Flow --      Pain Score 07/08/15 1617 10     Pain Loc --      Pain Edu? --      Excl. in GC? --     Constitutional: Alert and  oriented. Well appearing and in no acute distress. Eyes: Conjunctivae are normal. PERRL. EOMI. fundi do not show any hemorrhages or papilledema Head: Atraumatic. Nose: No congestion/rhinnorhea. Mouth/Throat: Mucous membranes are moist.  Oropharynx non-erythematous. Neck: No stridor. Cardiovascular: Normal rate, regular rhythm. Grossly normal heart sounds.  Good peripheral circulation. Respiratory: Normal respiratory effort.  No retractions. Lungs CTAB. Gastrointestinal: Soft and nontender. No distention. No abdominal bruits. No CVA tenderness. Musculoskeletal: No lower extremity tenderness nor edema.  No joint effusions. Neurologic:  Normal speech and language. No gross focal neurologic deficits are appreciated. No gait instability. Skin:  Skin is warm, dry and intact. No rash noted. Psychiatric: Mood and affect are normal. Speech and behavior are normal.  ____________________________________________   LABS (all labs ordered are listed, but only abnormal results are displayed)  Labs Reviewed  CBC WITH DIFFERENTIAL/PLATELET - Abnormal; Notable for the following:    WBC 11.8 (*)    Hemoglobin 11.3 (*)    MCH 25.9 (*)    RDW 16.9 (*)    Neutro Abs 7.6 (*)    Monocytes Absolute 1.0 (*)    All other components within normal limits  URINALYSIS COMPLETEWITH MICROSCOPIC ([facility] ONLY) - Abnormal; Notable for the following:    Color, Urine AMBER (*)    APPearance CLOUDY (*)    Hgb urine dipstick 3+ (*)    Protein, ur 100 (*)    Leukocytes, UA 3+ (*)    Squamous Epithelial / LPF 0-5 (*)    All other components within normal limits  COMPREHENSIVE METABOLIC PANEL  APTT   ____________________________________________  EKG  EKG read and interpreted by me shows sinus bradycardia rate of 52 normal axis no other EKG changes are seen ____________________________________________  RADIOLOGY  Ultrasound reported by the OB doctors to showing no  hemorrhage. ____________________________________________   PROCEDURES   ____________________________________________   INITIAL IMPRESSION / ASSESSMENT AND PLAN / ED COURSE  Pertinent labs & imaging results that were available during my care of the patient were reviewed by me and considered in my medical decision making (see chart for details). OB will admit the patient that C is still not back there is protein in the urine patient also appears to have a UTI  ____________________________________________   FINAL CLINICAL IMPRESSION(S) / ED DIAGNOSES  Final diagnoses:  Pre-eclampsia, unspecified trimester      Arnaldo Natal, MD 07/08/15 1757

## 2015-07-08 NOTE — ED Notes (Signed)
Pt reports a sudden feeling of lightheadedness and feeling as though her body "shifted downward" Pt is educated by this RN on the effects of medications and the vasodilation involved pt reports the feeling has passed and no longer feels lighthead. Pt verbalizes headache has returned. Lights have been dimmed and family at bedside. Pts door is open and RN able to monitor pt until moved upstairs.

## 2015-07-09 LAB — COMPREHENSIVE METABOLIC PANEL
ALBUMIN: 2.9 g/dL — AB (ref 3.5–5.0)
ALT: 33 U/L (ref 14–54)
ANION GAP: 8 (ref 5–15)
AST: 27 U/L (ref 15–41)
Alkaline Phosphatase: 150 U/L — ABNORMAL HIGH (ref 38–126)
BILIRUBIN TOTAL: 0.2 mg/dL — AB (ref 0.3–1.2)
BUN: 7 mg/dL (ref 6–20)
CHLORIDE: 111 mmol/L (ref 101–111)
CO2: 22 mmol/L (ref 22–32)
Calcium: 7.4 mg/dL — ABNORMAL LOW (ref 8.9–10.3)
Creatinine, Ser: 0.55 mg/dL (ref 0.44–1.00)
GFR calc Af Amer: >60 mL/min (ref >60)
GFR calc non Af Amer: >60 mL/min (ref >60)
GLUCOSE: 99 mg/dL (ref 65–99)
POTASSIUM: 3.4 mmol/L — AB (ref 3.5–5.1)
SODIUM: 141 mmol/L (ref 135–145)
TOTAL PROTEIN: 6.6 g/dL (ref 6.5–8.1)

## 2015-07-09 LAB — CBC
HEMATOCRIT: 33.6 % — AB (ref 35.0–47.0)
HEMOGLOBIN: 10.8 g/dL — AB (ref 12.0–16.0)
MCH: 25.9 pg — ABNORMAL LOW (ref 26.0–34.0)
MCHC: 32 g/dL (ref 32.0–36.0)
MCV: 81.1 fL (ref 80.0–100.0)
Platelets: 381 10*3/uL (ref 150–440)
RBC: 4.15 MIL/uL (ref 3.80–5.20)
RDW: 17 % — ABNORMAL HIGH (ref 11.5–14.5)
WBC: 11.4 10*3/uL — AB (ref 3.6–11.0)

## 2015-07-09 MED ORDER — SODIUM CHLORIDE 0.9 % IJ SOLN
3.0000 mL | Freq: Four times a day (QID) | INTRAMUSCULAR | Status: DC
  Administered 2015-07-10 – 2015-07-11 (×4): 3 mL via INTRAVENOUS
  Administered 2015-07-11: 10 mL via INTRAVENOUS
  Administered 2015-07-12 – 2015-07-13 (×4): 3 mL via INTRAVENOUS

## 2015-07-09 MED ORDER — ACETAMINOPHEN 325 MG PO TABS
ORAL_TABLET | ORAL | Status: AC
  Administered 2015-07-09: 650 mg via ORAL
  Filled 2015-07-09: qty 2

## 2015-07-09 MED ORDER — SENNOSIDES-DOCUSATE SODIUM 8.6-50 MG PO TABS
2.0000 | ORAL_TABLET | Freq: Two times a day (BID) | ORAL | Status: DC
  Administered 2015-07-10 – 2015-07-13 (×6): 2 via ORAL
  Filled 2015-07-09 (×6): qty 2

## 2015-07-09 MED ORDER — TRAMADOL HCL 50 MG PO TABS
50.0000 mg | ORAL_TABLET | Freq: Four times a day (QID) | ORAL | Status: DC
  Administered 2015-07-09 – 2015-07-13 (×16): 50 mg via ORAL
  Filled 2015-07-09 (×16): qty 1

## 2015-07-09 MED ORDER — SODIUM CHLORIDE 0.9 % IJ SOLN
INTRAMUSCULAR | Status: AC
  Filled 2015-07-09: qty 3

## 2015-07-09 MED ORDER — NIFEDIPINE ER 30 MG PO TB24
30.0000 mg | ORAL_TABLET | Freq: Every day | ORAL | Status: DC
  Administered 2015-07-09 – 2015-07-10 (×2): 30 mg via ORAL
  Filled 2015-07-09 (×3): qty 1

## 2015-07-09 MED ORDER — LACTATED RINGERS IV SOLN: 1000.0000 mL | INTRAVENOUS | Status: DC

## 2015-07-09 MED ORDER — IBUPROFEN 600 MG PO TABS
600.0000 mg | ORAL_TABLET | Freq: Four times a day (QID) | ORAL | Status: DC | PRN
  Administered 2015-07-09 – 2015-07-11 (×3): 600 mg via ORAL
  Filled 2015-07-09 (×2): qty 1

## 2015-07-09 MED ORDER — ACETAMINOPHEN 325 MG PO TABS
650.0000 mg | ORAL_TABLET | Freq: Four times a day (QID) | ORAL | Status: DC | PRN
  Administered 2015-07-09 – 2015-07-13 (×8): 650 mg via ORAL
  Filled 2015-07-09 (×7): qty 2

## 2015-07-09 MED ORDER — IBUPROFEN 600 MG PO TABS
ORAL_TABLET | ORAL | Status: AC
  Administered 2015-07-09: 600 mg via ORAL
  Filled 2015-07-09: qty 1

## 2015-07-09 NOTE — Progress Notes (Signed)
Progress Note:    PAD #1 Postpartum preeclampsia  S: Feeling much better. Has some remaining blurred vision in right eye. Denies upper abdominal pain, nausea, CP, SOB. HAs a dull headache remaining behind eyes.  O 137/63 Patient Vitals for the past 24 hrs:  BP Temp Temp src Pulse Resp SpO2  07/09/15 1405 129/77 mmHg 98.1 F (36.7 C) Oral 74 - -  07/09/15 1305 134/79 mmHg - - 82 - -  07/09/15 1205 130/72 mmHg - - 98 - -  07/09/15 1107 133/85 mmHg - - 86 - -  07/09/15 1005 138/80 mmHg - - 91 - -  07/09/15 0905 132/86 mmHg - - 91 - -  07/09/15 0805 119/77 mmHg - - 79 - -  07/09/15 0705 129/81 mmHg - - 81 - -  07/09/15 0605 132/73 mmHg - - 83 - -  07/09/15 0505 133/63 mmHg - - 89 - -  07/09/15 0405 121/77 mmHg - - 93 - -  07/09/15 0305 134/66 mmHg - - 85 - -  07/09/15 0205 127/70 mmHg - - 88 - -  07/09/15 0105 134/78 mmHg - - 98 - -  07/09/15 0005 135/64 mmHg - - (!) 101 - -  07/09/15 0000 124/77 mmHg - - (!) 101 - -  07/08/15 2305 125/75 mmHg - - 92 - -  07/08/15 2228 (!) 121/59 mmHg - - 99 - -  07/08/15 2144 135/68 mmHg - - (!) 104 - -  07/08/15 2000 139/81 mmHg - - - - -  07/08/15 1947 134/84 mmHg - - 69 14 100 %  07/08/15 1930 134/84 mmHg - - - - -  07/08/15 1929 - - - 72 - 100 %  07/08/15 1927 (!) 138/121 mmHg - - 78 14 97 %  07/08/15 1900 (!) 153/104 mmHg - - 65 - 99 %  07/08/15 1830 (!) 148/96 mmHg - - (!) 55 - 98 %  07/08/15 1816 (!) 161/101 mmHg - - - - -  07/08/15 1700 (!) 162/106 mmHg - - - - -   General : awake, alert, answering questions appropriately Heart: RRR without murmur Lungs: CTA Lower Ext: SCDs on, mild pedal edema Abdomen: soft, ND  Output: 40-700 ml/hr. 350 ml over last 2 hours Tolerating regular diet  A/P Postpartum preeclampsia  Now normotensive and diuresing-stop magnesium, remove foley  Continue I&O and monitor blood pressures  Transfer to M-C unit  Continue Procardia 30 mgm XL  Oslo Huntsman, CNM

## 2015-07-10 ENCOUNTER — Inpatient Hospital Stay: Payer: Medicaid Other

## 2015-07-10 MED ORDER — LABETALOL HCL 200 MG PO TABS
200.0000 mg | ORAL_TABLET | Freq: Three times a day (TID) | ORAL | Status: DC
  Administered 2015-07-10 – 2015-07-11 (×6): 200 mg via ORAL
  Filled 2015-07-10 (×6): qty 1

## 2015-07-10 MED ORDER — IOHEXOL 350 MG/ML SOLN
100.0000 mL | Freq: Once | INTRAVENOUS | Status: AC | PRN
  Administered 2015-07-10: 100 mL via INTRAVENOUS

## 2015-07-10 NOTE — Progress Notes (Signed)
CT neg, no PE Pt reports less h/a, chest pressure, and sx's.  Feels better. Last BP 130s/80s; series before that 150s/90s. Transitioned to Labetalol TID, will give time for response.

## 2015-07-10 NOTE — Progress Notes (Signed)
Admit Date: 07/08/2015 Today's Date: 07/10/2015  Post Partum Day 6.  HD#2- Readmission for PP Preclampsia.  Subjective:  Mary Terry is a 24 y.o. G3P3003 who is s/p a SVD on 07/04/2015 and discharged on 07/06/2015 without complication. She has a history of post partum pre-eclampsia requiring readmission and seizure ppx with magnesium sulfate with her last delivery. She presented to the ED with complaints of severe frontal HA and change of vision, as well as epigastric pain, and was found to have blood pressures in the 180s/110s. She denied shortness of breath except when lying down. Was admitted for treatment w antihypertensives and Magnesium sulfate that lasted for 24 hours.  Has been on po Nifedipine for BP.  See [place] and labs.  Today pt c/o headache visual changes and chest pressure that started earlier this am.  Objective: Temp:  [97.9 F (36.6 C)-98.7 F (37.1 C)] 98.2 F (36.8 C) (09/03 0719) Pulse Rate:  [55-108] 61 (09/03 1007) Resp:  [18-20] 18 (09/03 0719) BP: (111-157)/(48-91) CURRENT- 156/88 mmHg (09/03 1028) SpO2:  [98 %-100 %] 99 % (09/03 0719)  Physical Exam:  General: alert, cooperative and no distress Chest: clear Bilat Heart: reg Lochia: appropriate Uterine Fundus: firm DVT Evaluation: No evidence of DVT seen on physical exam. Negative Homan's sign. No edema.   Recent Labs  07/08/15 1614 07/09/15 0511  HGB 11.3* 10.8*  HCT 35.3 33.6*    Assessment/Plan: PP PRECLAMPSIA 1. Headache, Chest Pressure, visual changes have recurred as BPs have trended upwards toward the end of the 24 dosing of Nifedipine.  Will change to Labetalol TID.  Will check CT to eval for PE.  Symptomatic tx of sx's.  Cont to trend BPs.   Mag given for 24 hours, now off.  2. Breast pumping. 3. Cont PP care.   LOS: 2 days   Mary Terry 07/10/2015, 11:09 AM

## 2015-07-10 NOTE — Progress Notes (Signed)
   07/10/15 0900  Clinical Encounter Type  Visited With Patient  Visit Type Initial  Referral From Nurse  Spiritual Encounters  Spiritual Needs Prayer;Emotional  Stress Factors  Patient Stress Factors Family relationships  Family Stress Factors Family relationships  patient engaged with Mariea Clonts and discuss family and health concerns. Chaplain offered prayer and support. Patient expressed stress concerns regarding family and relationships. Chaplain will follow up.

## 2015-07-11 MED ORDER — FAMOTIDINE IN NACL 20-0.9 MG/50ML-% IV SOLN
20.0000 mg | Freq: Two times a day (BID) | INTRAVENOUS | Status: DC
  Administered 2015-07-11 (×3): 20 mg via INTRAVENOUS
  Filled 2015-07-11 (×4): qty 50

## 2015-07-11 NOTE — Plan of Care (Signed)
Problem: Phase II Progression Outcomes Goal: Afebrile, VS remain stable BP continue to be elevated intermittently; based on activity and stress level

## 2015-07-11 NOTE — Progress Notes (Signed)
Dr. Cindi Carbon notified of Pt. B/P of 136/102. Also, reported to Dr. Tiburcio Pea that Pt. Had received scheduled PO dose of Lebetalol at 2114. Dr. Tiburcio Pea stated that Pt. Was not to receive anymore B/P Meds at this time. He agreed that I would call him back at 2300 if Pt. B/P was <160 Systoilc and/or >110 Diastolic. Pt. Denies any other Preeclamptic S/S. Will cont. To follow closely.
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
## 2015-07-11 NOTE — Progress Notes (Signed)
One hour after giving labetalol patient stated "my chest feels heavy and I can feel my heart pounding". BP was rechecked. VS were within normal limits. MD notified

## 2015-07-11 NOTE — Progress Notes (Signed)
B/P is 138/78. Pt. Denies Headache or any other Preeclamptic S/S. Will cont. To follow closely.
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
## 2015-07-11 NOTE — Progress Notes (Signed)
Admit Date: 07/08/2015 Today's Date: 07/11/2015  Post Partum Day 7.  HD#3- Readmission for PP Preclampsia.  Subjective:  Mary Terry is a 24 y.o. G3P3003 who is s/p a SVD on 07/04/2015 and discharged on 07/06/2015 without complication. She has a history of post partum pre-eclampsia requiring readmission and seizure ppx with magnesium sulfate with her last delivery. She presented to the ED with complaints of severe frontal HA and change of vision, as well as epigastric pain, and was found to have blood pressures in the 180s/110s. She denied shortness of breath except when lying down. Was admitted for treatment w antihypertensives and Magnesium sulfate that lasted for 24 hours.  Has been on po Nifedipine for BP.  See [place] and labs.  Today pt c/o improved headache no visual changes and improved chest pressure from last night; Sx's noticebly worsened when BP higher. Also reports Pepcid greatly helped chest sx's. CT normal yesterday. See BP trends.  Objective: Temp:  [97.9 F (36.6 C)-98.7 F (37.1 C)] 98.2 F (36.8 C) (09/03 0719) Pulse Rate:  [55-108] 61 (09/03 1007) Resp:  [18-20] 18 (09/03 0719) BP: (111-157)/(48-91) CURRENT- 156/88 mmHg (09/03 1028)  One BP 170/80s. SpO2:  [98 %-100 %] 99 % (09/03 0719)  Physical Exam:  General: alert, cooperative and no distress Chest: clear Bilat Heart: reg Lochia: appropriate Uterine Fundus: firm DVT Evaluation: No evidence of DVT seen on physical exam. Negative Homan's sign. No edema.  Assessment/Plan: PP PRECLAMPSIA 1. Labetalol TID. Pepcid has helped as well, will continue.   Cont to trend BPs.    2. Mag given for 24 hours, now off.  3. Breast pumping. 4. Cont PP care.\ 5. D/c when longer trend of normal BPs and less sx's of h/a and chest pressure (seems to be resolving w acid therapy)   LOS: 3 days   Emilo Gras [facility] 07/11/2015, 11:57 AM

## 2015-07-12 MED ORDER — HYDRALAZINE HCL 20 MG/ML IJ SOLN
10.0000 mg | Freq: Once | INTRAMUSCULAR | Status: AC
  Administered 2015-07-12: 10 mg via INTRAVENOUS
  Filled 2015-07-12: qty 1

## 2015-07-12 MED ORDER — NIFEDIPINE ER 30 MG PO TB24
30.0000 mg | ORAL_TABLET | Freq: Every day | ORAL | Status: DC
  Administered 2015-07-12: 30 mg via ORAL
  Filled 2015-07-12 (×3): qty 1

## 2015-07-12 MED ORDER — LABETALOL HCL 5 MG/ML IV SOLN
20.0000 mg | Freq: Once | INTRAVENOUS | Status: AC
  Administered 2015-07-12: 20 mg via INTRAVENOUS

## 2015-07-12 MED ORDER — FAMOTIDINE 20 MG PO TABS
20.0000 mg | ORAL_TABLET | Freq: Two times a day (BID) | ORAL | Status: DC
  Administered 2015-07-12 – 2015-07-13 (×2): 20 mg via ORAL
  Filled 2015-07-12 (×2): qty 1

## 2015-07-12 MED ORDER — LABETALOL HCL 200 MG PO TABS
200.0000 mg | ORAL_TABLET | Freq: Once | ORAL | Status: AC
  Administered 2015-07-12: 200 mg via ORAL
  Filled 2015-07-12: qty 1

## 2015-07-12 MED ORDER — LABETALOL HCL 5 MG/ML IV SOLN
40.0000 mg | Freq: Once | INTRAVENOUS | Status: AC
  Administered 2015-07-12: 40 mg via INTRAVENOUS
  Filled 2015-07-12: qty 8

## 2015-07-12 MED ORDER — FUROSEMIDE 10 MG/ML IJ SOLN
20.0000 mg | Freq: Once | INTRAMUSCULAR | Status: AC
  Administered 2015-07-12: 20 mg via INTRAVENOUS
  Filled 2015-07-12 (×2): qty 2

## 2015-07-12 MED ORDER — LABETALOL HCL 5 MG/ML IV SOLN
20.0000 mg | Freq: Once | INTRAVENOUS | Status: AC
  Administered 2015-07-12: 20 mg via INTRAVENOUS
  Filled 2015-07-12: qty 4

## 2015-07-12 MED ORDER — LABETALOL HCL 100 MG PO TABS
300.0000 mg | ORAL_TABLET | Freq: Three times a day (TID) | ORAL | Status: DC
  Administered 2015-07-12 – 2015-07-13 (×5): 300 mg via ORAL
  Filled 2015-07-12 (×5): qty 1

## 2015-07-12 NOTE — Progress Notes (Signed)
Pt. is sleeping and B/P is 165/84. Pt Denies any other c/o or S/S Pre-Eclampsia. Color good, skin w&d. Dr. Tiburcio Pea notified. Labetalol . P.O.ordered now and TID dose of Labetalol increased to . Will cont. To follow Pt. Closely.

## 2015-07-12 NOTE — Progress Notes (Signed)
Notified Dr Elesa Massed, who is present

## 2015-07-12 NOTE — Progress Notes (Signed)
Obstetric and Gynecology  HD # 5  Subjective  Patient doing well, agitated by yesterday's events (FOB was aggressive and offensive to staff and escorted from the building, only to be inadvertently let back in and was resistant to removal the second time), and agitated by not knowing what happened in the ED with her medications.  Her headache has not resolved, and she states that it lightened earlier to about a 6 but now is back at a 10.  Chest pain is greatly improved/gone with pepcid.  Denies CP, SOB, F/C, N/V/D, or leg pain.   Objective  Objective:   Patient Vitals for the past 24 hrs:  BP Temp Temp src Pulse Resp SpO2  07/12/15 1032 137/84 mmHg - - - - -  07/12/15 1015 (!) 150/87 mmHg - - 67 - -  07/12/15 0939 (!) 170/91 mmHg - - - - -  07/12/15 0923 (!) 175/104 mmHg - - - - -  07/12/15 0835 (!) 165/89 mmHg - - - - -  07/12/15 0820 (!) 187/92 mmHg - - (!) 57 - -  07/12/15 0759 (!) 166/87 mmHg - - 61 - -  07/12/15 0748 (!) 187/94 mmHg - - (!) 57 - -  07/12/15 0744 (!) 183/96 mmHg - - (!) 59 - -  07/12/15 0741 (!) 185/100 mmHg - - (!) 59 - -  07/12/15 0717 (!) 196/93 mmHg 98.9 F (37.2 C) Oral 61 18 99 %  07/12/15 0604 (!) 145/97 mmHg - - (!) 58 - -  07/12/15 0533 (!) 164/83 mmHg - - - - -  07/12/15 0415 (!) 165/84 mmHg - - (!) 51 - -  07/12/15 0355 (!) 165/84 mmHg 98.4 F (36.9 C) Oral (!) 51 20 100 %  07/11/15 2312 138/78 mmHg 98.2 F (36.8 C) Oral (!) 51 20 100 %  07/11/15 2148 (!) 136/102 mmHg 98.3 F (36.8 C) Oral 60 20 100 %    General: NAD Cardiovascular: RRR, no murmurs Pulmonary: CTAB, normal respiratory effort Abdomen: Benign. Non-tender, +BS, no guarding. Extremities: No erythema or cords, no calf tenderness, with normal peripheral pulses.  Assessment   24 y.o. [id number] Hospital Day: 5 With post partum preeclampsia with severe features  Plan   1. Preeclampsia:  meds have been altered and substituted but not maintained and added upon. Perhaps this has  contributed to less than optimal control.  Her headache is a symptom of both blood pressures and brain edema.     >> Restart nifedipine  xr, IN ADDITION TO the Labetalol 300 TID.     >> Push Labetalol and/or hydralazine PRN for persistently elevated pressures   >> Consider cardiology consult if BPs do not improve with medication changes   >> PRES: Will add 1x  Lasix IV push to attempt to decrease brain edema and improve HA.  Continue Tramadol.   2. Social: FOB is still evicted from the hospital, and mom and baby are more comfortable in this environment.  Will consult SW to evaluate and consider home visit after discharge.  ----- Ranae Plumber, MD Attending Obstetrician and Gynecologist [facility]    ----------  Addendum: Labetalol  then  pushed initially, then Hydralazine  and Lasix  with PO Nifedipine  XR given, in addition to her Labetalol  TID.  See MAR for times.  BPs improved after last intervention.  Will continue to check BPs and intervene PRN.  -CW  10:50am 07/12/15

## 2015-07-12 NOTE — Progress Notes (Signed)
Notify Brielle, Rn of bp 

## 2015-07-12 NOTE — Progress Notes (Signed)
IV to PO conversion:    24 yo female ordered Famotidine  IV BID.  Patient is eating and taking other meds by mouth.  Will transition to Famotidine  PO BID as per policy.    Stormy Card, [facility]  Clinical Pharmacist 07/12/2015

## 2015-07-12 NOTE — Progress Notes (Signed)
Found baby and Mom both asleep in Mom's bed. Educated Mom on Safe Sleep and the importance of placing baby in basinet. Mom refused to place baby in basinet at this time.

## 2015-07-12 NOTE — Progress Notes (Signed)
Notified Brielle, Rn of bp 

## 2015-07-12 NOTE — Progress Notes (Signed)
Took over shift this morning, BPs as listed below: Patient Vitals for the past 24 hrs:  BP Temp Temp src Pulse Resp SpO2  07/12/15 0835 (!) 165/89 mmHg - - - - -  07/12/15 0820 (!) 187/92 mmHg - - (!) 57 - -  07/12/15 0759 (!) 166/87 mmHg - - 61 - -  07/12/15 0748 (!) 187/94 mmHg - - (!) 57 - -  07/12/15 0744 (!) 183/96 mmHg - - (!) 59 - -  07/12/15 0741 (!) 185/100 mmHg - - (!) 59 - -  07/12/15 0717 (!) 196/93 mmHg 98.9 F (37.2 C) Oral 61 18 99 %  07/12/15 0604 (!) 145/97 mmHg - - (!) 58 - -  07/12/15 0533 (!) 164/83 mmHg - - - - -  07/12/15 0415 (!) 165/84 mmHg - - (!) 51 - -  07/12/15 0355 (!) 165/84 mmHg 98.4 F (36.9 C) Oral (!) 51 20 100 %  07/11/15 2312 138/78 mmHg 98.2 F (36.8 C) Oral (!) 51 20 100 %  07/11/15 2148 (!) 136/102 mmHg 98.3 F (36.8 C) Oral 60 20 100 %  07/11/15 2114 (!) 175/100 mmHg - - (!) 57 - -  07/11/15 1848 (!) 166/87 mmHg - - - - -  07/11/15 1603 (!) 160/85 mmHg 98.9 F (37.2 C) Oral (!) 58 - 100 %  07/11/15 1249 (!) 143/90 mmHg 98.2 F (36.8 C) - (!) 51 20 98 %  07/11/15 1050 (!) 144/71 mmHg - - - - -   Patient had received  then  of Labetalol IV prior to my eval; pushed  of Labetalol and gave  NIfedipine XR PO.    Will repeat value after admin of Labetalol and will reassess interventions at that time.  ----- Ranae Plumber, MD Attending Obstetrician and Gynecologist [facility]

## 2015-07-13 DIAGNOSIS — I517 Cardiomegaly: Secondary | ICD-10-CM | POA: Clinically undetermined

## 2015-07-13 DIAGNOSIS — N289 Disorder of kidney and ureter, unspecified: Secondary | ICD-10-CM

## 2015-07-13 LAB — COMPREHENSIVE METABOLIC PANEL
ALBUMIN: 3.4 g/dL — AB (ref 3.5–5.0)
ALK PHOS: 123 U/L (ref 38–126)
ALT: 19 U/L (ref 14–54)
ANION GAP: 10 (ref 5–15)
AST: 16 U/L (ref 15–41)
BILIRUBIN TOTAL: 0.6 mg/dL (ref 0.3–1.2)
BUN: 13 mg/dL (ref 6–20)
CALCIUM: 9.5 mg/dL (ref 8.9–10.3)
CO2: 21 mmol/L — ABNORMAL LOW (ref 22–32)
CREATININE: 1.11 mg/dL — AB (ref 0.44–1.00)
Chloride: 109 mmol/L (ref 101–111)
GFR calc non Af Amer: >60 mL/min (ref >60)
GLUCOSE: 85 mg/dL (ref 65–99)
Potassium: 4.1 mmol/L (ref 3.5–5.1)
Sodium: 140 mmol/L (ref 135–145)
TOTAL PROTEIN: 7.6 g/dL (ref 6.5–8.1)

## 2015-07-13 LAB — CBC
HEMATOCRIT: 40 % (ref 35.0–47.0)
HEMOGLOBIN: 12.7 g/dL (ref 12.0–16.0)
MCH: 25.5 pg — AB (ref 26.0–34.0)
MCHC: 31.8 g/dL — AB (ref 32.0–36.0)
MCV: 80.2 fL (ref 80.0–100.0)
Platelets: 484 10*3/uL — ABNORMAL HIGH (ref 150–440)
RBC: 4.98 MIL/uL (ref 3.80–5.20)
RDW: 16.7 % — ABNORMAL HIGH (ref 11.5–14.5)
WBC: 11.8 10*3/uL — ABNORMAL HIGH (ref 3.6–11.0)

## 2015-07-13 MED ORDER — NIFEDIPINE ER 30 MG PO TB24: 30.0000 mg | ORAL_TABLET | Freq: Two times a day (BID) | ORAL | Status: DC

## 2015-07-13 MED ORDER — LABETALOL HCL 300 MG PO TABS: 300.0000 mg | ORAL_TABLET | Freq: Two times a day (BID) | ORAL | Status: DC

## 2015-07-13 MED ORDER — NIFEDIPINE ER 30 MG PO TB24
30.0000 mg | ORAL_TABLET | Freq: Two times a day (BID) | ORAL | Status: DC
  Administered 2015-07-13: 30 mg via ORAL
  Filled 2015-07-13 (×2): qty 1

## 2015-07-13 MED ORDER — NIFEDIPINE ER 30 MG PO TB24: 30.0000 mg | ORAL_TABLET | Freq: Every day | ORAL | Status: DC

## 2015-07-13 NOTE — Progress Notes (Signed)
Obstetric and Gynecology  Subjective  "When can I go home?"  Patient feeling better, headache improved.  No further chest pain or shortness of breath  Objective   Filed Vitals:   07/13/15 0729  BP: 135/71  Pulse: 66  Temp: 98.2 F (36.8 C)  Resp: 18    No intake or output data in the 24 hours ending 07/13/15 1013  General: NAD Lungs: CTAB, no crackles Cardiovascular: RRR Abdomen: soft, non-tender, non-distended Extremities: no edema  Labs: Results for orders placed or performed during the hospital encounter of 07/08/15 (from the past 24 hour(s))  CBC     Status: Abnormal   Collection Time: 07/13/15  8:10 AM  Result Value Ref Range   WBC 11.8 (H) 3.6 - 11.0 K/uL   RBC 4.98 3.80 - 5.20 MIL/uL   Hemoglobin 12.7 12.0 - 16.0 g/dL   HCT 16.1 09.6 - 04.5 %   MCV 80.2 80.0 - 100.0 fL   MCH 25.5 (L) 26.0 - 34.0 pg   MCHC 31.8 (L) 32.0 - 36.0 g/dL   RDW 40.9 (H) 81.1 - 91.4 %   Platelets 484 (H) 150 - 440 K/uL  Comprehensive metabolic panel     Status: Abnormal   Collection Time: 07/13/15  8:10 AM  Result Value Ref Range   Sodium 140 135 - 145 mmol/L   Potassium 4.1 3.5 - 5.1 mmol/L   Chloride 109 101 - 111 mmol/L   CO2 21 (L) 22 - 32 mmol/L   Glucose, Bld 85 65 - 99 mg/dL   BUN 13 6 - 20 mg/dL   Creatinine, Ser 7.82 (H) 0.44 - 1.00 mg/dL   Calcium 9.5 8.9 - 95.6 mg/dL   Total Protein 7.6 6.5 - 8.1 g/dL   Albumin 3.4 (L) 3.5 - 5.0 g/dL   AST 16 15 - 41 U/L   ALT 19 14 - 54 U/L   Alkaline Phosphatase 123 38 - 126 U/L   Total Bilirubin 0.6 0.3 - 1.2 mg/dL   GFR calc non Af Amer >60 >60 mL/min   GFR calc Af Amer >60 >60 mL/min   Anion gap 10 5 - 15    Cultures: Results for orders placed or performed during the hospital encounter of 07/04/15  OB RESULT CONSOLE Group B Strep     Status: None   Collection Time: 06/07/15 10:17 AM  Result Value Ref Range Status   GBS Negative  Final  OB RESULT CONSOLE Group B Strep     Status: None   Collection Time: 06/07/15 10:17 AM   Result Value Ref Range Status   GBS Positive  Final  OB RESULT CONSOLE Group B Strep     Status: None   Collection Time: 06/07/15 10:17 AM  Result Value Ref Range Status   GBS Positive  Final  OB RESULT CONSOLE Group B Strep     Status: None   Collection Time: 06/08/15 12:00 AM  Result Value Ref Range Status   GBS Positive  Final    Comment: IS GBS +  Chlamydia/NGC rt PCR ([facility] only)     Status: None   Collection Time: 07/04/15  8:26 AM  Result Value Ref Range Status   Specimen source GC/Chlam URINE, RANDOM  Final   Chlamydia Tr NOT DETECTED NOT DETECTED Final   N gonorrhoeae NOT DETECTED NOT DETECTED Final    Comment: (NOTE) 100  This methodology has not been evaluated in pregnant women or in 200  patients with a history of hysterectomy.  300 400  This methodology will not be performed on patients less than 55  years of age.     Imaging Ct Angio Chest Pe W/cm &/or Wo Cm  07/10/2015   CLINICAL DATA:  24 year old female with hypertension status post trans vaginal delivery. Anterior chest pressure starting earlier today.  EXAM: CT ANGIOGRAPHY CHEST WITH CONTRAST  TECHNIQUE: Multidetector CT imaging of the chest was performed using the standard protocol during bolus administration of intravenous contrast. Multiplanar CT image reconstructions and MIPs were obtained to evaluate the vascular anatomy.  CONTRAST:  OMNIPAQUE IOHEXOL 350 MG/ML SOLN  COMPARISON:  Recent prior CT PE study 04/05/2014  FINDINGS: Mediastinum: Unremarkable CT appearance of the thyroid gland. No suspicious mediastinal or hilar adenopathy. No soft tissue mediastinal mass. The thoracic esophagus is unremarkable.  Heart/Vascular: Adequate opacification of the pulmonary arteries to the proximal subsegmental level. No evidence of central filling defect to suggest acute pulmonary embolus. The main pulmonary artery is within normal limits for size. 2 vessel aortic arch anatomy. The right brachiocephalic and left common  carotid artery share a common origin. No evidence of aneurysm or dissection. Developing mild cardiomegaly. The heart appears slightly enlarged compared to prior imaging. There is no pericardial effusion.  Lungs/Pleura: Stable 4 mm subpleural pulmonary nodule in the anterior aspect of the right upper lobe. This is almost certainly the sequelae of old granulomatous disease. No further imaging follow-up is required secondary to patient demographics. Otherwise, the lungs are clear. No edema or pleural effusion.  Bones/Soft Tissues: No acute fracture or aggressive appearing lytic or blastic osseous lesion.  Upper Abdomen: Visualized upper abdominal organs are unremarkable.  Review of the MIP images confirms the above findings.  IMPRESSION: 1. Negative for acute pulmonary embolus, pneumonia or other acute cardiopulmonary process. 2. Interval development of borderline cardiomegaly. The heart appears slightly enlarged compared to 04/05/2014.   Electronically Signed   By: Malachy Moan M.D.   On: 07/10/2015 14:51   US Abdomen Limited Ruq  07/08/2015   CLINICAL DATA:  Severe right upper quadrant pain. Hypertension. Two days post delivery.  EXAM: US ABDOMEN LIMITED - RIGHT UPPER QUADRANT  COMPARISON:  CT, 04/06/2014.  FINDINGS: Gallbladder:  Gallbladder is mildly distended. Wall appears thickened and edematous. No gallstone is seen. Wall measures 4.8 mm. No specific tenderness to transducer pressure over the gallbladder.  Common bile duct:  Diameter: 4.0 mm  Liver:  Liver measures mildly enlarged, measuring 21 cm. Normal liver parenchymal echogenicity. No mass or focal lesion. Hepatopetal flow documented in the portal vein.  IMPRESSION: 1. Gallbladder wall is thickened with some edema, but there is no gallstone and no sonographic Murphy's sign. Wall thickening may be reactive. 2. Liver measures mildly enlarged but is otherwise unremarkable. It did not appear enlarged on the prior CT. No liver mass or focal lesion.    Electronically Signed   By: Amie Portland M.D.   On: 07/08/2015 17:40     Assessment   24 y.o. [id number] currently admitted for postpartum preeclampsia  Plan   1) Postpartum preeclampsia  - required IV antihypertensive yesterday - repeat preE labs this morning with Cr bump, trend - LFT, H&H, and platelets normal  2) SOB/pressure - PE CT negative, note was made of mild cardiomegaly.  Symptoms now resolved but postpartum cardiomyopathy remains in differential - Discussed case with Dr. Adrian Blackwater of cardiology.  Patient is scheduled for outpatient Echocardiogram at [facility] 10:30 on Friday 07/16/15  3) Disposition - if BP continue  to run normotensive may go home this evening although given constellation of findings my recommendation was for another day in house with repeat labs in AM

## 2015-07-13 NOTE — Progress Notes (Signed)
D/C order from MD.  Reviewed d/c instructions and prescriptions with patient and answered any questions.  Patient d/c home via wheelchair by nursing/auxillary. 

## 2015-07-13 NOTE — Progress Notes (Signed)
   07/13/15 1000  Clinical Encounter Type  Visited With Patient  Visit Type Follow-up  Referral From Nurse  Spiritual Encounters  Spiritual Needs Emotional  Stress Factors  Patient Stress Factors Family relationships  Family Stress Factors Family relationships  Chaplain Maisie Fus engaged patient and followed up with her regarding family and life decisions. Chaplain will visit again.

## 2015-07-13 NOTE — Discharge Summary (Signed)
Physician Discharge Summary  Patient ID: Mary Terry MRN: [id number] DOB/AGE: 28-Dec-1990 23 y.o.  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  She was readmitted, started on 24-hr of magnesium sulfate.  She required IV antihypertensive for worsening blood pressures on 07/12/15 but normalized blood pressures the following day.  She did experience shortness of breath and chest pressure and had a negative PE CT study.  These symptoms had resolved by the time of discharge.  Her PE CT did show borderline cardiomegaly for which she will follow up with cardiology on outpatient basis.  Her Cr did elevated slightly to 1.05 but urine output was normal.    Consults: None  Significant Diagnostic Studies: PE CT and RUQ ultrasound  Disposition: 01-Home or Self Care      Discharge Instructions    Activity as tolerated    Complete by:  As directed      Call MD for:  difficulty breathing, headache or visual disturbances    Complete by:  As directed      Call MD for:  extreme fatigue    Complete by:  As directed      Call MD for:  hives    Complete by:  As directed      Call MD for:  persistant dizziness or light-headedness    Complete by:  As directed      Call MD for:  persistant nausea and vomiting    Complete by:  As directed      Call MD for:  severe uncontrolled pain    Complete by:  As directed      Diet - low sodium heart healthy    Complete by:  As directed      Sexual acrtivity    Complete by:  As directed   No intercourse for 6 weeks            Medication  List    STOP taking these medications        benzocaine-Menthol 20-0.5 % Aero  Commonly known as:  DERMOPLAST     docusate sodium 100 MG capsule  Commonly known as:  COLACE      TAKE these medications        HYDROcodone-acetaminophen 5-325 MG per tablet  Commonly known as:  NORCO/VICODIN  Take 1 tablet by mouth every 4 (four) hours as needed for moderate pain.     ibuprofen 600 MG tablet  Commonly known as:  ADVIL,MOTRIN  Take 1 tablet (600 mg total) by mouth every 6 (six) hours as needed.     labetalol 300 MG tablet  Commonly known as:  NORMODYNE  Take 1 tablet (300 mg total) by mouth 2 (two) times daily.     multivitamin-prenatal 27-0.8 MG Tabs tablet  Take 1 tablet by mouth daily at 12 noon.     NIFEdipine 30 MG 24 hr tablet  Commonly known as:  PROCARDIA-XL/ADALAT CC  Take 1 tablet (30 mg total) by mouth every 12 (twelve) hours.      ASK your doctor about these medications        ferrous sulfate 325 (65 FE) MG tablet  Take 1 tablet (325 mg  total) by mouth daily with breakfast.       Follow-up Information    Follow up with Laurier Nancy, MD. Go on 07/16/2015.   Specialty:  Cardiology   Why:  Echocardiogram at 10:30 AM   Contact information:   [address] [place] [address] [phone]       Follow up with [place] Bing, MD. Go in 2 days.   Specialty:  Obstetrics and Gynecology   Why:  BP check 2:45 PM   Contact information:   [address] [place] [address] [phone]       Signed: Lorrene Reid 07/13/2015, 4:17 PM

## 2016-03-08 ENCOUNTER — Emergency Department
Admission: EM | Admit: 2016-03-08 | Discharge: 2016-03-09 | Disposition: A | Discharge status: Other Acute Inpatient Hospital | Payer: Medicaid Other | Attending: Emergency Medicine | Admitting: Emergency Medicine

## 2016-03-08 ENCOUNTER — Encounter: Payer: Self-pay | Admitting: *Deleted

## 2016-03-08 DIAGNOSIS — R258 Other abnormal involuntary movements: Secondary | ICD-10-CM | POA: Insufficient documentation

## 2016-03-08 DIAGNOSIS — F10920 Alcohol use, unspecified with intoxication, uncomplicated: Secondary | ICD-10-CM

## 2016-03-08 DIAGNOSIS — F17210 Nicotine dependence, cigarettes, uncomplicated: Secondary | ICD-10-CM | POA: Diagnosis not present

## 2016-03-08 DIAGNOSIS — Z046 Encounter for general psychiatric examination, requested by authority: Secondary | ICD-10-CM

## 2016-03-08 DIAGNOSIS — F10120 Alcohol abuse with intoxication, uncomplicated: Secondary | ICD-10-CM | POA: Insufficient documentation

## 2016-03-08 DIAGNOSIS — F1290 Cannabis use, unspecified, uncomplicated: Secondary | ICD-10-CM | POA: Diagnosis not present

## 2016-03-08 DIAGNOSIS — R45851 Suicidal ideations: Secondary | ICD-10-CM | POA: Diagnosis present

## 2016-03-08 DIAGNOSIS — F29 Unspecified psychosis not due to a substance or known physiological condition: Secondary | ICD-10-CM | POA: Diagnosis not present

## 2016-03-08 DIAGNOSIS — F333 Major depressive disorder, recurrent, severe with psychotic symptoms: Secondary | ICD-10-CM | POA: Diagnosis not present

## 2016-03-08 LAB — COMPREHENSIVE METABOLIC PANEL
ALBUMIN: 4.9 g/dL (ref 3.5–5.0)
ALK PHOS: 62 U/L (ref 38–126)
ALT: 17 U/L (ref 14–54)
AST: 19 U/L (ref 15–41)
Anion gap: 12 (ref 5–15)
BILIRUBIN TOTAL: 0.4 mg/dL (ref 0.3–1.2)
BUN: 11 mg/dL (ref 6–20)
CO2: 23 mmol/L (ref 22–32)
CREATININE: 1.01 mg/dL — AB (ref 0.44–1.00)
Calcium: 9.3 mg/dL (ref 8.9–10.3)
Chloride: 108 mmol/L (ref 101–111)
GFR calc non Af Amer: >60 mL/min (ref >60)
GLUCOSE: 85 mg/dL (ref 65–99)
Potassium: 3.7 mmol/L (ref 3.5–5.1)
SODIUM: 143 mmol/L (ref 135–145)
TOTAL PROTEIN: 8.5 g/dL — AB (ref 6.5–8.1)

## 2016-03-08 LAB — URINE DRUG SCREEN, QUALITATIVE (ARMC ONLY)
AMPHETAMINES, UR SCREEN: NOT DETECTED
BARBITURATES, UR SCREEN: NOT DETECTED
BENZODIAZEPINE, UR SCRN: NOT DETECTED
CANNABINOID 50 NG, UR ~~LOC~~: POSITIVE — AB
Cocaine Metabolite,Ur ~~LOC~~: NOT DETECTED
MDMA (Ecstasy)Ur Screen: NOT DETECTED
Methadone Scn, Ur: NOT DETECTED
Opiate, Ur Screen: NOT DETECTED
PHENCYCLIDINE (PCP) UR S: NOT DETECTED
Tricyclic, Ur Screen: NOT DETECTED

## 2016-03-08 LAB — SALICYLATE LEVEL: Salicylate Lvl: <4 mg/dL (ref 2.8–30.0)

## 2016-03-08 LAB — CBC
HCT: 43.9 % (ref 35.0–47.0)
Hemoglobin: 14.2 g/dL (ref 12.0–16.0)
MCH: 28.3 pg (ref 26.0–34.0)
MCHC: 32.3 g/dL (ref 32.0–36.0)
MCV: 87.5 fL (ref 80.0–100.0)
PLATELETS: 312 10*3/uL (ref 150–440)
RBC: 5.02 MIL/uL (ref 3.80–5.20)
RDW: 14.8 % — AB (ref 11.5–14.5)
WBC: 11.7 10*3/uL — AB (ref 3.6–11.0)

## 2016-03-08 LAB — ETHANOL: ALCOHOL ETHYL (B): 127 mg/dL — AB (ref <5)

## 2016-03-08 LAB — ACETAMINOPHEN LEVEL: Acetaminophen (Tylenol), Serum: <10 ug/mL — ABNORMAL LOW (ref 10–30)

## 2016-03-08 MED ORDER — DIPHENHYDRAMINE HCL 25 MG PO CAPS
ORAL_CAPSULE | ORAL | Status: AC
Start: 2016-03-08 — End: 2016-03-08
  Administered 2016-03-08: 25 mg via ORAL
  Filled 2016-03-08: qty 1

## 2016-03-08 MED ORDER — DIPHENHYDRAMINE HCL 25 MG PO CAPS
25.0000 mg | ORAL_CAPSULE | Freq: Once | ORAL | Status: AC
  Administered 2016-03-08: 25 mg via ORAL

## 2016-03-08 MED ORDER — ONDANSETRON 4 MG PO TBDP
4.0000 mg | ORAL_TABLET | Freq: Once | ORAL | Status: AC
  Administered 2016-03-08: 4 mg via ORAL

## 2016-03-08 MED ORDER — ONDANSETRON 4 MG PO TBDP
ORAL_TABLET | ORAL | Status: AC
  Administered 2016-03-08: 4 mg via ORAL
  Filled 2016-03-08: qty 1

## 2016-03-08 NOTE — ED Notes (Signed)
Pt. To BHU from ED ambulatory without difficulty, to room BHU 2. Report from [facility]Charles RN. Pt. Is alert and oriented, warm and dry in no distress. Pt. Denies SI, HI. Patient reports having some AVH, patient would not go in to details on what she is hearing or seeing with this Clinical research associatewriter. Pt. Calm and cooperative. Pt. Made aware of security cameras and Q15 minute rounds. Pt. Encouraged to let Nursing staff know of any concerns or needs.

## 2016-03-08 NOTE — ED Notes (Signed)
BEHAVIORAL HEALTH ROUNDING Patient sleeping: No. Patient alert and oriented: yes Behavior appropriate: Yes.  ; If no, describe:  Nutrition and fluids offered: yes Toileting and hygiene offered: Yes  Sitter present: q15 minute observations and security  monitoring Law enforcement present: Yes  ODS  

## 2016-03-08 NOTE — ED Notes (Signed)

## 2016-03-08 NOTE — ED Notes (Signed)
ED BHU PLACEMENT JUSTIFICATION Is the patient under IVC or is there intent for IVC: Yes.   Is the patient medically cleared: Yes.   Is there vacancy in the ED BHU: Yes.   Is the population mix appropriate for patient: Yes.   Is the patient awaiting placement in inpatient or outpatient setting: No Has the patient had a psychiatric consult: No. Survey of unit performed for contraband, proper placement and condition of furniture, tampering with fixtures in bathroom, shower, and each patient room: Yes.  ; Findings: na APPEARANCE/BEHAVIOR calm, cooperative and adequate rapport can be established NEURO ASSESSMENT Orientation: time, place and person Hallucinations: Yes.  Auditory Hallucinations and Visual Hallucinations Speech: Normal Gait: normal RESPIRATORY ASSESSMENT Normal expansion.  Clear to auscultation.  No rales, rhonchi, or wheezing. CARDIOVASCULAR ASSESSMENT regular rate and rhythm, S1, S2 normal, no murmur, click, rub or gallop GASTROINTESTINAL ASSESSMENT soft, nontender, BS WNL, no r/g EXTREMITIES normal strength, tone, and muscle mass PLAN OF CARE Provide calm/safe environment. Vital signs assessed twice daily. ED BHU Assessment once each 12-hour shift. Collaborate with intake RN daily or as condition indicates. Assure the ED provider has rounded once each shift. Provide and encourage hygiene. Provide redirection as needed. Assess for escalating behavior; address immediately and inform ED provider.  Assess family dynamic and appropriateness for visitation as needed: Yes.  ; If necessary, describe findings: NA Educate the patient/family about BHU procedures/visitation: Yes.  ; If necessary, describe findings: NA

## 2016-03-08 NOTE — ED Notes (Signed)
Pt with c/o N/V and x1 episode of emesis into the sink following a "coughing spell". York CeriseForbach, MD made aware with [facility] for 4mg  Zofran ODT to be entered and carried out by this RN.
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
## 2016-03-08 NOTE — ED Notes (Signed)
Gwynneth MunsonButch, RN and York CeriseForbach, MD at bedside at this time.

## 2016-03-08 NOTE — ED Notes (Signed)

## 2016-03-08 NOTE — ED Notes (Signed)
Pt arrives from [facility] with [employer] in IVC custody for SI, states she was giggiling uncontroablly, talking to self, upon arrival pt states "I am not telling you a d--- thing because you dont care", pt states marijuina abuse and ETOH abuse, at present pt refused to answer to SI or HI

## 2016-03-08 NOTE — ED Notes (Signed)
Patient complained of not sleeping, verbal one time order for Benadryl 25 MG po received at 2251 and given at 2253. Will continue to monitor.

## 2016-03-09 ENCOUNTER — Inpatient Hospital Stay
Admission: RE | Admit: 2016-03-09 | Discharge: 2016-03-12 | DRG: 885 | Disposition: A | Discharge status: Home / Self Care | Payer: Medicaid Other | Source: Intra-hospital | Attending: Psychiatry | Admitting: Psychiatry

## 2016-03-09 DIAGNOSIS — Z79899 Other long term (current) drug therapy: Secondary | ICD-10-CM | POA: Diagnosis not present

## 2016-03-09 DIAGNOSIS — I517 Cardiomegaly: Secondary | ICD-10-CM | POA: Diagnosis present

## 2016-03-09 DIAGNOSIS — F17210 Nicotine dependence, cigarettes, uncomplicated: Secondary | ICD-10-CM | POA: Diagnosis present

## 2016-03-09 DIAGNOSIS — F1210 Cannabis abuse, uncomplicated: Secondary | ICD-10-CM | POA: Diagnosis present

## 2016-03-09 DIAGNOSIS — F429 Obsessive-compulsive disorder, unspecified: Secondary | ICD-10-CM | POA: Diagnosis present

## 2016-03-09 DIAGNOSIS — F10120 Alcohol abuse with intoxication, uncomplicated: Secondary | ICD-10-CM | POA: Diagnosis not present

## 2016-03-09 DIAGNOSIS — R45850 Homicidal ideations: Secondary | ICD-10-CM | POA: Diagnosis present

## 2016-03-09 DIAGNOSIS — R45851 Suicidal ideations: Secondary | ICD-10-CM

## 2016-03-09 DIAGNOSIS — G8929 Other chronic pain: Secondary | ICD-10-CM | POA: Diagnosis present

## 2016-03-09 DIAGNOSIS — F1010 Alcohol abuse, uncomplicated: Secondary | ICD-10-CM | POA: Diagnosis present

## 2016-03-09 DIAGNOSIS — G4700 Insomnia, unspecified: Secondary | ICD-10-CM | POA: Diagnosis present

## 2016-03-09 DIAGNOSIS — F419 Anxiety disorder, unspecified: Secondary | ICD-10-CM | POA: Diagnosis present

## 2016-03-09 DIAGNOSIS — F333 Major depressive disorder, recurrent, severe with psychotic symptoms: Secondary | ICD-10-CM | POA: Diagnosis present

## 2016-03-09 DIAGNOSIS — R440 Auditory hallucinations: Secondary | ICD-10-CM | POA: Diagnosis present

## 2016-03-09 DIAGNOSIS — R441 Visual hallucinations: Secondary | ICD-10-CM | POA: Diagnosis present

## 2016-03-09 DIAGNOSIS — Z885 Allergy status to narcotic agent status: Secondary | ICD-10-CM

## 2016-03-09 DIAGNOSIS — F17200 Nicotine dependence, unspecified, uncomplicated: Secondary | ICD-10-CM | POA: Diagnosis present

## 2016-03-09 MED ORDER — ALUM & MAG HYDROXIDE-SIMETH 200-200-20 MG/5ML PO SUSP
30.0000 mL | ORAL | Status: DC | PRN
Start: 2016-03-09 — End: 2016-03-12

## 2016-03-09 MED ORDER — LORAZEPAM 2 MG PO TABS
2.0000 mg | ORAL_TABLET | ORAL | Status: DC
  Administered 2016-03-09: 2 mg via ORAL

## 2016-03-09 MED ORDER — LORAZEPAM 2 MG PO TABS
2.0000 mg | ORAL_TABLET | ORAL | Status: AC
  Administered 2016-03-09: 2 mg via ORAL
  Filled 2016-03-09: qty 1

## 2016-03-09 MED ORDER — LORAZEPAM 2 MG PO TABS
2.0000 mg | ORAL_TABLET | ORAL | Status: DC | PRN
  Administered 2016-03-09 – 2016-03-12 (×6): 2 mg via ORAL
  Filled 2016-03-09 (×6): qty 1

## 2016-03-09 MED ORDER — LORAZEPAM 2 MG PO TABS
ORAL_TABLET | ORAL | Status: AC
  Filled 2016-03-09: qty 1

## 2016-03-09 MED ORDER — ZIPRASIDONE MESYLATE 20 MG IM SOLR
INTRAMUSCULAR | Status: AC
  Filled 2016-03-09: qty 20

## 2016-03-09 MED ORDER — MAGNESIUM HYDROXIDE 400 MG/5ML PO SUSP: 30.0000 mL | Freq: Every day | ORAL | Status: DC | PRN

## 2016-03-09 MED ORDER — ZIPRASIDONE MESYLATE 20 MG IM SOLR: 10.0000 mg | Freq: Four times a day (QID) | INTRAMUSCULAR | Status: DC | PRN

## 2016-03-09 MED ORDER — ZIPRASIDONE HCL 20 MG PO CAPS
ORAL_CAPSULE | ORAL | Status: AC
  Filled 2016-03-09: qty 1

## 2016-03-09 MED ORDER — ACETAMINOPHEN 325 MG PO TABS
650.0000 mg | ORAL_TABLET | Freq: Four times a day (QID) | ORAL | Status: DC | PRN
  Administered 2016-03-09 – 2016-03-11 (×3): 650 mg via ORAL
  Filled 2016-03-09 (×2): qty 2
  Filled 2016-03-09: qty 1
  Filled 2016-03-09: qty 2

## 2016-03-09 MED ORDER — QUETIAPINE FUMARATE 25 MG PO TABS
100.0000 mg | ORAL_TABLET | Freq: Every day | ORAL | Status: DC
Start: 2016-03-09 — End: 2016-03-09

## 2016-03-09 MED ORDER — LORAZEPAM 2 MG PO TABS: 2.0000 mg | ORAL_TABLET | ORAL | Status: DC | PRN

## 2016-03-09 MED ORDER — QUETIAPINE FUMARATE 100 MG PO TABS
100.0000 mg | ORAL_TABLET | Freq: Every day | ORAL | Status: DC
  Administered 2016-03-09: 100 mg via ORAL
  Filled 2016-03-09: qty 1

## 2016-03-09 NOTE — [facility] Group Notes (Signed)
[facility] Group Notes:  (Nursing/MHT/Case Management/Adjunct)  Date:  03/09/2016  Time:  4:03 PM  Type of Therapy:  Group Therapy  Participation Level:  Did Not Attend  Axyl Sitzman De'Chelle Keyira Mondesir 03/09/2016, 4:03 PM

## 2016-03-09 NOTE — ED Notes (Signed)
Patient currently denies SI/HI/AVH and pain. Patient to be transferred to inpatient unit. All belongings sent to inpatient unit with patient. Patient escorted to unit by police.

## 2016-03-09 NOTE — ED Notes (Signed)
Patient currently in room eating lunch. Patient appears to be calm at this time. Maintained on 15 minute checks and observation by security camera for safety.

## 2016-03-09 NOTE — ED Notes (Signed)
Patient asleep in room. No noted distress or abnormal behavior. Will continue 15 minute checks and observation by security cameras for safety. 

## 2016-03-09 NOTE — ED Notes (Signed)
Patient says that she would not like to end her life but that she considers death often and what it would be like to die. Patient denies homicidal ideation but states that she has auditory and visual hallucinations. Her voices tell her that she's nothing and that her babies are demons. Patient has been severely depressed and has a depressed mood and flat affect. Patient is calm and cooperative. Maintained on 15 minute checks and observation by security camera for safety.

## 2016-03-09 NOTE — ED Notes (Signed)
Patient came to nurses station asking for new pants and undergarments. Patient states she has started her menses. This Clinical research associatewriter got patient new pants and undergarment and a pad.

## 2016-03-09 NOTE — ED Notes (Signed)
Patient came up to the nurse's station asking to use the phone to call her boyfriend. Nurse told patient that phone hours began again at 1 pm and that she could leave a message at that time with him if he doesn't answer. Patient then walked to her room and slammed the door, dismantled her bed and placed the mattress and sheets in front of the door in an attempt to barricade it. Patient then stood on the bed and poured milk on the camera. Security then went in to talk with patient to tell her to that she would have to take an IM injection to help her become more calm. Patient stated that she would take the medicine by mouth. At this time, nurse administered 2mg  ativan PO and patient then laid down in the bed in an attempt to rest. Will continue to monitor for increased signs of aggression. Maintained on 15 minute checks and observation by security camera for safety.

## 2016-03-09 NOTE — Plan of Care (Signed)
Problem: Alteration in mood Goal: LTG-Patient reports reduction in suicidal thoughts (Patient reports reduction in suicidal thoughts and is able to verbalize a safety plan for whenever patient is feeling suicidal)  Outcome: Progressing Patient denies suicidal ideations      
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
## 2016-03-09 NOTE — Progress Notes (Signed)
This writer staffed this patient with [place], PA it is recommended to refer for inpatient hospitalization.      Yolette Hastings, MSW, LCSW, LCAS [facility] Triage Specialist [phone] 
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
## 2016-03-09 NOTE — Progress Notes (Signed)
Admission Note:  D: Pt appeared depressed  With  a flat affect.  Pt  denies SI / AVH at this time. Patient reports going to [facility] , stated she  dosnt remenber rolling in the grass or being handcuffed . Stated they told her once she got here. Patient has strong cursing skills.  Easily agitated. No broken areas on skin tattoos all over body. Stated she is hearing voices and they tell her "she anit shit"  Patient has a history with psychosis as child  And into adulthood . History of substance abusePt is redirectable and cooperative with assessment.      A: Pt admitted to unit per protocol, skin assessment and search done and no contraband found.  Pt  educated on therapeutic milieu rules. Pt was introduced to milieu by nursing staff.    R: Pt was receptive to education about the milieu .  15 min safety checks started. Clinical research associatewriter offered support

## 2016-03-09 NOTE — ED Notes (Signed)
ENVIRONMENTAL ASSESSMENT Potentially harmful objects out of patient reach: Yes Personal belongings secured: Yes Patient dressed in hospital provided attire only: Yes Plastic bags out of patient reach: Yes Patient care equipment (cords, cables, call bells, lines, and drains) shortened, removed, or accounted for: Yes Equipment and supplies removed from bottom of stretcher: Yes Potentially toxic materials out of patient reach: Yes Sharps container removed or out of patient reach: Yes  Patient is currently in room sleeping. No signs of distress noted. Maintained on 15 minute checks and observation by security camera for safety.  

## 2016-03-09 NOTE — Tx Team (Signed)
Initial Interdisciplinary Treatment Plan   PATIENT STRESSORS: Financial difficulties Medication change or noncompliance Substance abuse   PATIENT STRENGTHS: Capable of independent living Communication skills Supportive family/friends   PROBLEM LIST: Problem List/Patient Goals Date to be addressed Date deferred Reason deferred Estimated date of resolution  Substance Abuse 03/09/16     Suicidal  03/09/16     Depressed 03/09/16                                          DISCHARGE CRITERIA:  Ability to meet basic life and health needs Motivation to continue treatment in a less acute level of care Safe-care adequate arrangements made  PRELIMINARY DISCHARGE PLAN: Outpatient therapy Return to previous living arrangement  PATIENT/FAMIILY INVOLVEMENT: This treatment plan has been presented to and reviewed with the patient, Mary Terry, and/or family member, .  The patient and family have been given the opportunity to ask questions and make suggestions.  Mary Terry 03/09/2016, 5:08 PM

## 2016-03-10 ENCOUNTER — Encounter: Payer: Self-pay | Admitting: Psychiatry

## 2016-03-10 DIAGNOSIS — F1210 Cannabis abuse, uncomplicated: Secondary | ICD-10-CM | POA: Diagnosis present

## 2016-03-10 DIAGNOSIS — F333 Major depressive disorder, recurrent, severe with psychotic symptoms: Principal | ICD-10-CM

## 2016-03-10 DIAGNOSIS — F17200 Nicotine dependence, unspecified, uncomplicated: Secondary | ICD-10-CM | POA: Diagnosis present

## 2016-03-10 LAB — LIPID PANEL
CHOLESTEROL: 184 mg/dL (ref 0–200)
HDL: 46 mg/dL (ref >40)
LDL Cholesterol: 101 mg/dL — ABNORMAL HIGH (ref 0–99)
TRIGLYCERIDES: 186 mg/dL — AB (ref <150)
Total CHOL/HDL Ratio: 4 RATIO
VLDL: 37 mg/dL (ref 0–40)

## 2016-03-10 LAB — HEMOGLOBIN A1C: HEMOGLOBIN A1C: 5.5 % (ref 4.0–6.0)

## 2016-03-10 LAB — TSH: TSH: 0.4 u[IU]/mL (ref 0.350–4.500)

## 2016-03-10 LAB — HCG, QUANTITATIVE, PREGNANCY: hCG, Beta Chain, Quant, S: 1 m[IU]/mL (ref <5)

## 2016-03-10 MED ORDER — FLUVOXAMINE MALEATE 50 MG PO TABS
50.0000 mg | ORAL_TABLET | Freq: Every day | ORAL | Status: DC
  Administered 2016-03-10: 50 mg via ORAL
  Filled 2016-03-10: qty 1

## 2016-03-10 MED ORDER — QUETIAPINE FUMARATE 25 MG PO TABS
25.0000 mg | ORAL_TABLET | Freq: Three times a day (TID) | ORAL | Status: DC
Start: 2016-03-10 — End: 2016-03-12
  Administered 2016-03-10 – 2016-03-12 (×8): 25 mg via ORAL
  Filled 2016-03-10 (×8): qty 1

## 2016-03-10 MED ORDER — DICLOFENAC SODIUM 1 % TD GEL
4.0000 g | Freq: Four times a day (QID) | TRANSDERMAL | Status: DC | PRN
  Filled 2016-03-10 (×2): qty 100

## 2016-03-10 MED ORDER — NICOTINE 21 MG/24HR TD PT24
21.0000 mg | MEDICATED_PATCH | Freq: Every day | TRANSDERMAL | Status: DC
  Administered 2016-03-10: 21 mg via TRANSDERMAL
  Filled 2016-03-10 (×4): qty 1

## 2016-03-10 MED ORDER — TEMAZEPAM 15 MG PO CAPS
15.0000 mg | ORAL_CAPSULE | Freq: Every day | ORAL | Status: DC
  Administered 2016-03-10 – 2016-03-11 (×2): 15 mg via ORAL
  Filled 2016-03-10 (×2): qty 1

## 2016-03-10 MED ORDER — QUETIAPINE FUMARATE 200 MG PO TABS
200.0000 mg | ORAL_TABLET | Freq: Every day | ORAL | Status: DC
  Administered 2016-03-10 – 2016-03-11 (×2): 200 mg via ORAL
  Filled 2016-03-10 (×3): qty 1
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
## 2016-03-10 NOTE — Plan of Care (Signed)
Problem: Ineffective individual coping Goal: LTG: Patient will report a decrease in negative feelings Outcome: Progressing Patient reports that her mood is slightly better today than it was yesterday.     

## 2016-03-10 NOTE — Progress Notes (Signed)
Patient is again requesting pain medication for her neck and back.  Patient is angry, yelling, cursing and slamming her door.  Patient states that Tylenol and Voltaren gel will not help her pain.  Patient is redirectable with the help of Security.  Patient agrees to take ordered Tylenol and Ativan.  Patient calms down and goes to group.
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
## 2016-03-10 NOTE — [facility] Suicide Risk Assessment (Signed)
[facility] Admission Suicide Risk Assessment   Nursing information obtained from:    Demographic factors:    Current Mental Status:    Loss Factors:    Historical Factors:    Risk Reduction Factors:     Total Time spent with patient: 1 hour Principal Problem: Depression, major, recurrent, severe with psychosis (HCC) Diagnosis:   Patient Active Problem List   Diagnosis Date Noted  . Depression, major, recurrent, severe with psychosis (HCC) [F33.3] 03/09/2016  . Suicidal ideation [R45.851] 03/09/2016  . Alcohol abuse [F10.10] 03/09/2016  . Severe recurrent major depression with psychotic features (HCC) [F33.3] 03/09/2016  . Mild cardiomegaly [I51.7] 07/13/2015  . Renal insufficiency [N28.9] 07/13/2015  . 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 History of substance abuse [Z87.898] 07/04/2015  . Postpartum care following vaginal delivery [Z39.2] 07/04/2015   Subjective Data: Depression, anxiety, insomnia, auditory and visual hallucinations.  Continued Clinical Symptoms:  Alcohol Use Disorder Identification Test Final Score (AUDIT): 10 The "Alcohol Use Disorders Identification Test", Guidelines for Use in Primary Care, Second Edition.  World Science writer [facility]). Score between 0-7:  no or low risk or alcohol related problems. Score between 8-15:  moderate risk of alcohol related problems. Score between 16-19:  high risk of alcohol related problems. Score 20 or above:  warrants further diagnostic evaluation for alcohol dependence and treatment.   CLINICAL FACTORS:   Depression:   Comorbid alcohol abuse/dependence Impulsivity Insomnia Severe Alcohol/Substance Abuse/Dependencies Chronic Pain Currently Psychotic   Musculoskeletal: Strength & Muscle Tone: within normal limits Gait & Station: normal Patient leans: N/A  Psychiatric Specialty Exam: Review of Systems  Psychiatric/Behavioral: Positive for  depression and hallucinations. The patient is nervous/anxious and has insomnia.   All other systems reviewed and are negative.   Blood pressure 121/72, pulse 83, temperature 97.6 F (36.4 C), temperature source Oral, resp. rate 18, height  (1.626 m), weight 87.998 kg (194 lb), SpO2 98 %, unknown if currently breastfeeding.Body mass index is 33.28 kg/(m^2).  General Appearance: Casual  Eye Contact::  Minimal  Speech:  Clear and Coherent  Volume:  Decreased  Mood:  Anxious  Affect:  Tearful  Thought Process:  Goal Directed  Orientation:  Full (Time, Place, and Person)  Thought Content:  Hallucinations: Auditory Command:  Telling her to cut things Visual  Suicidal Thoughts:  Yes.  with intent/plan  Homicidal Thoughts:  No  Memory:  Immediate;   Fair Recent;   Fair Remote;   Fair  Judgement:  Impaired  Insight:  Shallow  Psychomotor Activity:  Normal  Concentration:  Fair  Recall:  [employer] of Knowledge:Fair  Language: Fair  Akathisia:  No  Handed:  Right  AIMS (if indicated):     Assets:  Communication Skills Desire for Improvement Financial Resources/Insurance Housing Intimacy Physical Health Resilience Social Support  Sleep:     Cognition: WNL  ADL's:  Intact    COGNITIVE FEATURES THAT CONTRIBUTE TO RISK:  None    SUICIDE RISK:   Severe:  Frequent, intense, and enduring suicidal ideation, specific plan, no subjective intent, but some objective markers of intent (i.e., choice of lethal method), the method is accessible, some limited preparatory behavior, evidence of impaired self-control, severe dysphoria/symptomatology, multiple risk factors present, and few if any protective factors, particularly a lack of social support.  PLAN OF CARE: Hospital admission, medication management, substance abuse counseling, discharge planning.  Mary Terry is a 25 year old female with remote history of depression admitted in a  psychotic episode.  1. Suicidal ideation. The  patient is able to contract for safety in the hospital.  2. Mood and psychosis. She was started on Seroquel. We will increase the dose at night and low dose Seroquel during the day.   3. Insomnia. We'll start Restoril.  4. Metabolic syndrome screening. Lipid profile, hemoglobin A1c, TSH and prolactin are pending.  5. Disposition. She will be discharged to home with her boyfriend. She will follow up with [facility].  I certify that inpatient services furnished can reasonably be expected to improve the patient's condition.   Mary LineaJolanta Pucilowska, MD 03/10/2016, 9:58 AM

## 2016-03-10 NOTE — Progress Notes (Signed)
D:  Patient is alert and oriented on the unit this shift.  Patient did not attend groups today.  Patient denies suicidal ideation or homicidal ideation at the present time.  Patient endorses auditory or visual hallucinations at the present time.  Patient is somewhat isolative on the unit this shift.  Patient was not happy with her breakfast tray so she threw it on the floor in the hallway.  Another breakfast tray was ordered.  Patient was encouraged to complete her menu.  Patient is complaining of neck and back pain.  Patient refuses Tylenol and Voltaren gel which are ordered for her pain currently.  Patient reports she needs stronger pain medicine.   A:  Scheduled medications are administered to patient as per MD orders.  Emotional support and encouragement are provided.  Patient is maintained on q.15 minute safety checks.  Patient is informed to notify staff with questions or concerns. R:  No adverse medication reactions are noted.  Patient is cooperative with medication administration today.  Patient is labile and easily angered on the unit at this time.  Patient interacts minimally with others on the unit this shift.  Patient contracts for safety at this time.  Patient remains safe at this time.
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
## 2016-03-10 NOTE — [facility] Group Notes (Signed)
[facility] LCSW Group Therapy  03/10/2016 1:59 PM  Type of Therapy:  Group Therapy  Participation Level:  Did Not Attend  Summary of Progress/Problems: Patient was called to group but did not attend.   Lulu RidingIngle, Modest Draeger T, MSW, LCSW 03/10/2016, 1:58 PM

## 2016-03-10 NOTE — [facility] Group Notes (Signed)
[facility] Group Notes:  (Nursing/MHT/Case Management/Adjunct)  Date:  03/10/2016  Time:  3:55 PM  Type of Therapy:  Psychoeducational Skills  Participation Level:  Active  Participation Quality:  Appropriate and Sharing  Affect:  Appropriate  Cognitive:  Alert and Appropriate  Insight:  Appropriate and Good  Engagement in Group:  Developing/Improving and Engaged  Modes of Intervention:  Discussion, Education and Support  Summary of Progress/Problems:  Mary Terry 03/10/2016, 3:55 PM

## 2016-03-10 NOTE — Plan of Care (Signed)
Problem: Ineffective individual coping Goal: STG: Patient will remain free from self harm Outcome: Progressing Patient denies SI at this time.     

## 2016-03-10 NOTE — Plan of Care (Signed)
Problem: Ineffective individual coping Goal: STG-Increase in ability to manage activities of daily living Outcome: Progressing Patient is managing her activities of daily living currently     

## 2016-03-10 NOTE — Progress Notes (Addendum)
It was reported that the patient had a clipboard. A rm search was performed with MHT with patient in room. Clipboard was confiscated. A more in-depth rm search was done when patient was not in room. Cigarette was found on ground. Another cigarette was found in the pillow case. [place] RN came in to help with search. Patient came in while search was in progress and became agitated. Patient then threw the waste basket in this writer's direction. A security code was called. Patient was then verbally deescalated. Ativan 2mg  PO was given along with scheduled Seroquel. Patient apologized for her actions and went to sleep after calming down.

## 2016-03-10 NOTE — Progress Notes (Signed)
Recreation Therapy Notes  Date: 05.05.17 Time: 9:30 am Location: Craft Room  Group Topic: Coping Skills  Goal Area(s) Addresses:  Patient will participate in healthy coping skill.  Behavioral Response: Did not attend  Intervention: Coloring  Activity: Patients were instructed to color coloring sheets and think about what emotions they were experiencing as well as what they were thinking about while they were coloring.  Education: LRT educated patients on healthy coping skills.  Education Outcome: Patient did not attend group.  Clinical Observations/Feedback: Patient did not attend group.  Jacquelynn CreeGreene,Wannetta Langland M, LRT/CTRS 03/10/2016 10:18 AM

## 2016-03-11 LAB — PROLACTIN: PROLACTIN: 5.9 ng/mL (ref 4.8–23.3)

## 2016-03-11 MED ORDER — METHOCARBAMOL 750 MG PO TABS
750.0000 mg | ORAL_TABLET | Freq: Three times a day (TID) | ORAL | Status: DC
  Administered 2016-03-11 – 2016-03-12 (×5): 750 mg via ORAL
  Filled 2016-03-11 (×9): qty 1

## 2016-03-11 MED ORDER — FLUVOXAMINE MALEATE 50 MG PO TABS
100.0000 mg | ORAL_TABLET | Freq: Every day | ORAL | Status: DC
  Administered 2016-03-11: 100 mg via ORAL
  Filled 2016-03-11: qty 2

## 2016-03-11 NOTE — Progress Notes (Signed)
Patient has 1:1 sitter.  Patient ate lunch in the dayroom and is cooperative with noon medication administration.

## 2016-03-11 NOTE — Progress Notes (Signed)
Sleeping. Sitter present 

## 2016-03-11 NOTE — Progress Notes (Signed)
D: Pt had a visit from her boyfriend at start of shift. This visit was supervised by our Chiropodistassistant director. Pt came to nurses station stating that she was going to work on positive coping skills. Pt then went into her room and staff began to smell cigarette smoke. Staff went in and Pt had flushed the cigarette down the toilet. Her room was searched and two additional cigarettes and a book of matches were found in Pts room. Pt denied smoking and denied having any matches or cigarettes. Items were confiscated and pt was placed on 1:1 for safety. Denies SI/AVH at this time. Mood is labile.  A: Encouragement and support provided. Q15 minute checks maintained for safety. Medications given as prescribed. Pt placed on 1:1 supervision.  R: Pt remains safe on unit. Voices no additional concerns at this time. Will continue to monitor.

## 2016-03-11 NOTE — Progress Notes (Signed)
Patient has 1:1 sitter.  Patient is going to group.

## 2016-03-11 NOTE — Progress Notes (Signed)
Patient has 1:1 sitter.  Patient is resting in bed. 

## 2016-03-11 NOTE — Progress Notes (Signed)
Patient has 1:1 sitter.  Patient is resting in bed.

## 2016-03-11 NOTE — Progress Notes (Signed)
Patient was not seen on the unit at approximately 1800.  Security and Building control surveyornurse supervisor were called.  Unit was searched.  Patient was found in unused office next to the community room.  Patient and her room were searched.  Two pens were found on patient's person.

## 2016-03-11 NOTE — Progress Notes (Signed)
Patient has 1:1 sitter.  Patient talking with family on the phone.

## 2016-03-11 NOTE — Progress Notes (Signed)
Patient has 1:1 sitter.  Patient ate her dinner in the community room, then was cooperative with medication administration.

## 2016-03-11 NOTE — Progress Notes (Signed)
Pt resting in bed, awake. Sitter present.

## 2016-03-11 NOTE — Progress Notes (Signed)
Pt sleeping. Sitter present.

## 2016-03-11 NOTE — Progress Notes (Signed)
D:  Patient is alert and oriented on the unit this shift.  Patient attended and actively participated in groups today.  Patient denies suicidal ideation, homicidal ideation, auditory or visual hallucinations at the present time.  Patient reports that her mood is improved this shift.  Patient reports that she is doing her best to comply with treatment so she may be discharged as soon as possible.   A:  Scheduled medications are administered to patient as per MD orders.  Emotional support and encouragement are provided.  Patient is maintained on q.15 minute safety checks.  Patient is informed to notify staff with questions or concerns. R:  No adverse medication reactions are noted.  Patient is cooperative with medication administration and treatment plan today.  Patient is receptive, calm and cooperative on the unit at this time.  Patient interacts minimally with others on the unit this shift.  Patient contracts for safety at this time.  Patient remains safe at this time.  Patient remains with 1:1 sitter currently.

## 2016-03-11 NOTE — Progress Notes (Signed)
Pt with visitor (boyfriend). ChiropodistAssistant director present.

## 2016-03-11 NOTE — Progress Notes (Signed)
[facility] MD Progress Note  03/11/2016 5:14 PM Mary Terry  MRN:  [id number]  Subjective: Mary Terry reports some improvement today her mood is more stable she is not crying during the interview but still giggling as if attending to internal stimuli. She accepted medications and things to tolerate them well. She got into trouble last night. Apparently her boyfriend's mother cigarettes that she is in her room. She was given sitter for safety. Her boyfriend will not be allowed to visit again.  she reports now that she has had auditory hallucinations for many months prior even today. Her baby 9 months ago. Her visual hallucinations have started several months ago and both have done worse in the past month. Her diagnosis is still uncertain will be bipolar disorder, major depressive disorder with psychotic features or schizophrenia.   Principal Problem: Major depressive disorder, recurrent, severe with psychotic features (HCC) Diagnosis:   Patient Active Problem List   Diagnosis Date Noted  . Tobacco use disorder [F17.200] 03/10/2016  . Cannabis use disorder, mild, abuse [F12.10] 03/10/2016  . Suicidal ideation [R45.851] 03/09/2016  . Alcohol use disorder, mild, abuse [F10.10] 03/09/2016  . Major depressive disorder, recurrent, severe with psychotic features (HCC) [F33.3] 03/09/2016  . Mild cardiomegaly [I51.7] 07/13/2015  . Renal insufficiency [N28.9] 07/13/2015   Total Time spent with patient: 20 minutes  Past Psychiatric History: Depression.ast Medical History:  Past Medical History  Diagnosis Date  . Herpes genitalis   . Hx of preeclampsia, prior pregnancy, currently pregnant   . History of substance abuse     Past Surgical History  Procedure Laterality Date  . No past surgeries     Family History:  Family History  Problem Relation Age of Onset  . Family history unknown: Yes   Family Psychiatric  History: See H&P.ial History:  History  Alcohol Use No     History  Drug Use  . Yes   . Special: Marijuana    Social History   Social History  . Marital Status: Divorced    Spouse Name: N/A  . Number of Children: N/A  . Years of Education: N/A   Social History Main Topics  . Smoking status: Current Every Day Smoker -- 0.25 packs/day for 3 years    Types: Cigarettes  . Smokeless tobacco: Never Used  . Alcohol Use: No  . Drug Use: Yes    Special: Marijuana  . Sexual Activity: Yes   Other Topics Concern  . None   Social History Narrative   Additional Social History:                         Sleep: Fair  Appetite:  Fair  Current Medications: Current Facility-Administered Medications  Medication Dose Route Frequency Provider Last Rate Last Dose  . acetaminophen (TYLENOL) tablet 650 mg  650 mg Oral Q6H PRN Audery Amel, MD   650 mg at 03/10/16 1500  . alum & mag hydroxide-simeth (MAALOX/MYLANTA) 200-200-20 MG/5ML suspension 30 mL  30 mL Oral Q4H PRN Audery Amel, MD      . diclofenac sodium (VOLTAREN) 1 % transdermal gel 4 g  4 g Topical QID PRN Adalyn Pennock B Yoshiko Keleher, MD      . fluvoxaMINE (LUVOX) tablet 100 mg  100 mg Oral QHS Gordie Belvin B Jayelyn Barno, MD      . LORazepam (ATIVAN) tablet 2 mg  2 mg Oral Q4H PRN Audery Amel, MD   2 mg at 03/11/16 0930  .  magnesium hydroxide (MILK OF MAGNESIA) suspension 30 mL  30 mL Oral Daily PRN Audery Amel, MD      . methocarbamol (ROBAXIN) tablet 750 mg  750 mg Oral TID Shari Prows, MD   750 mg at 03/11/16 1653  . nicotine (NICODERM CQ - dosed in mg/24 hours) patch 21 mg  21 mg Transdermal Daily Jerzy Roepke B Gwenneth Whiteman, MD   21 mg at 03/10/16 1854  . QUEtiapine (SEROQUEL) tablet 200 mg  200 mg Oral QHS Shari Prows, MD   200 mg at 03/10/16 2045  . QUEtiapine (SEROQUEL) tablet 25 mg  25 mg Oral TID Shari Prows, MD   25 mg at 03/11/16 1653  . temazepam (RESTORIL) capsule 15 mg  15 mg Oral QHS Shari Prows, MD   15 mg at 03/10/16 2045  . ziprasidone (GEODON) injection 10 mg  10 mg  Intramuscular Q6H PRN Brandy Hale, MD        Lab Results:  Results for orders placed or performed during the hospital encounter of 03/09/16 (from the past 48 hour(s))  Hemoglobin A1c     Status: None   Collection Time: 03/10/16  9:40 AM  Result Value Ref Range   Hgb A1c MFr Bld 5.5 4.0 - 6.0 %  Lipid panel, fasting     Status: Abnormal   Collection Time: 03/10/16  9:40 AM  Result Value Ref Range   Cholesterol 184 0 - 200 mg/dL   Triglycerides 409 (H) <150 mg/dL   HDL 46 >81 mg/dL   Total CHOL/HDL Ratio 4.0 RATIO   VLDL 37 0 - 40 mg/dL   LDL Cholesterol 191 (H) 0 - 99 mg/dL    Comment:        Total Cholesterol/HDL:CHD Risk Coronary Heart Disease Risk Table                     Men   Women  1/2 Average Risk   3.4   3.3  Average Risk       5.0   4.4  2 X Average Risk   9.6   7.1  3 X Average Risk  23.4   11.0        Use the calculated Patient Ratio above and the CHD Risk Table to determine the patient's CHD Risk.        ATP III CLASSIFICATION (LDL):  <100     mg/dL   Optimal  478-295  mg/dL   Near or Above                    Optimal  130-159  mg/dL   Borderline  621-308  mg/dL   High  >657     mg/dL   Very High   Prolactin     Status: None   Collection Time: 03/10/16  9:40 AM  Result Value Ref Range   Prolactin 5.9 4.8 - 23.3 ng/mL    Comment: (NOTE) Performed At: [facility] [address] [place], [place] [id number] Mila Homer MD WU:[id number]   TSH     Status: None   Collection Time: 03/10/16  9:40 AM  Result Value Ref Range   TSH 0.400 0.350 - 4.500 uIU/mL  hCG, quantitative, pregnancy     Status: None   Collection Time: 03/10/16  9:40 AM  Result Value Ref Range   hCG, Beta Chain, Quant, S 1 <5 mIU/mL    Comment:  GEST. AGE      CONC.  (mIU/mL)   <=1 WEEK        5 - 50     2 WEEKS       50 - 500     3 WEEKS       100 - 10,000     4 WEEKS     1,000 - 30,000     5 WEEKS     3,500 - 115,000   6-8 WEEKS     12,000 - 270,000    12  WEEKS     15,000 - 220,000        FEMALE AND NON-PREGNANT FEMALE:     LESS THAN 5 mIU/mL     Blood Alcohol level:  Lab Results  Component Value Date   [facility] 127* 03/08/2016    Physical Findings: AIMS: Facial and Oral Movements Muscles of Facial Expression: None, normal Lips and Perioral Area: None, normal Jaw: None, normal Tongue: None, normal,Extremity Movements Upper (arms, wrists, hands, fingers): None, normal Lower (legs, knees, ankles, toes): None, normal, Trunk Movements Neck, shoulders, hips: None, normal, Overall Severity Severity of abnormal movements (highest score from questions above): None, normal Incapacitation due to abnormal movements: None, normal Patient's awareness of abnormal movements (rate only patient's report): No Awareness, Dental Status Current problems with teeth and/or dentures?: No Does patient usually wear dentures?: No  CIWA:    COWS:     Musculoskeletal: Strength & Muscle Tone: within normal limits Gait & Station: normal Patient leans: N/A  Psychiatric Specialty Exam: Review of Systems  Psychiatric/Behavioral: Positive for depression and hallucinations. The patient is nervous/anxious.   All other systems reviewed and are negative.   Blood pressure 116/54, pulse 68, temperature 97.6 F (36.4 C), temperature source Oral, resp. rate 18, height 5\' 4"  (1.626 m), weight 87.998 kg (194 lb), SpO2 98 %, unknown if currently breastfeeding.Body mass index is 33.28 kg/(m^2).  General Appearance: Casual  Eye Contact::  Good  Speech:  Clear and Coherent  Volume:  Normal  Mood:  Euphoric  Affect:  Daily contact with patient to assess and evaluate symptoms and progress in treatment and Medication management  Thought Process:  Goal Directed  Orientation:  Full (Time, Place, and Person)  Thought Content:  Delusions, Hallucinations: Auditory and Paranoid Ideation  Suicidal Thoughts:  Yes.  with intent/plan  Homicidal Thoughts:  No  Memory:  Immediate;    Fair Recent;   Fair Remote;   Fair  Judgement:  Poor  Insight:  Lacking  Psychomotor Activity:  Normal  Concentration:  Fair  Recall:  [employer] of Knowledge:Fair  Language: Fair  Akathisia:  No  Handed:  Right  AIMS (if indicated):     Assets:  Communication Skills Desire for Improvement Financial Resources/Insurance Housing Intimacy Physical Health Resilience Social Support  ADL's:  Intact  Cognition: WNL  Sleep:      Treatment Plan Summary: Daily contact with patient to assess and evaluate symptoms and progress in treatment and Medication management   Mary Terry is a 25 year old female with remote history of depression admitted in a psychotic episode.  1. Suicidal ideation. The patient is able to contract for safety in the hospital.  2. Mood and psychosis. She was started on Seroquel for psychosis. We will increase the dose at night and low dose Seroquel during the day. I started Trileptal for mood stabilization and Luvox for OCD and depression. Will increase dose to 100 mg tonight.  3. Insomnia. We'll  start Restoril.  4. Metabolic syndrome screening. Lipid profile, hemoglobin A1c, TSH and prolactin are normal.   5. Neck pain. We will offer Voltaren gel. She may need and x-ray.   6. Alcohol abuse. The patient denies any symptoms of withdrawal. We will monitor.   7. Substance abuse treatment. She is a marijuana smoker. She minimizes her problem and declines treatment.  8. Smoking. Nicotine patch is available. The patient has a sitter at bed site due to dangerous contraband. No visitations allowed.  9. R/O pregnancy. Pregnancy is negative.   10. Disposition. She will be discharged to home with her boyfriend. She will follow up with [facility].  Kristine LineaJolanta Denecia Brunette, MD 03/11/2016, 5:14 PM

## 2016-03-11 NOTE — Plan of Care (Signed)
Problem: Ineffective individual coping Goal: STG: Patient will remain free from self harm Outcome: Progressing Patient remains free from self harm currently     

## 2016-03-11 NOTE — Progress Notes (Signed)
Patient has 1:1 sitter.  Patient eats breakfast and is cooperative with morning medication administration.

## 2016-03-11 NOTE — Progress Notes (Signed)
Patient has 1:1 sitter.  Patient is in the dayroom

## 2016-03-11 NOTE — Plan of Care (Signed)
Problem: Ineffective individual coping Goal: LTG: Patient will report a decrease in negative feelings Outcome: Progressing Patient reports that her mood is improved today

## 2016-03-11 NOTE — [facility] Group Notes (Signed)
[facility] LCSW Group Therapy  03/11/2016 1:57 PM  Type of Therapy:  Group Therapy  Participation Level:  Did Not Attend  Modes of Intervention:  Discussion, Education, Socialization and Support  Summary of Progress/Problems: Pt will identify unhealthy thoughts and how they impact their emotions and behavior. Pt will be encouraged to discuss these thoughts, emotions and behaviors with the group.   Lionel Woodberry L Kenlie Seki MSW, LCSWA  03/11/2016, 1:57 PM      

## 2016-03-11 NOTE — Progress Notes (Signed)
Pt in room. Staff finishing searching room for cigarettes and matches. ChiropodistAssistant director present.

## 2016-03-11 NOTE — Progress Notes (Signed)
Pt getting out of shower. Sitter present.

## 2016-03-11 NOTE — Progress Notes (Signed)
Pt resting in bed with eyes closed. Sitter present. Sitter reported that Pt had urinated in bed and that was the reason she had gotten up to shower.

## 2016-03-12 MED ORDER — QUETIAPINE FUMARATE 200 MG PO TABS: 200.0000 mg | ORAL_TABLET | Freq: Every day | ORAL | Status: DC

## 2016-03-12 MED ORDER — METHOCARBAMOL 750 MG PO TABS: 750.0000 mg | ORAL_TABLET | Freq: Three times a day (TID) | ORAL | Status: DC

## 2016-03-12 MED ORDER — FLUVOXAMINE MALEATE 100 MG PO TABS: 100.0000 mg | ORAL_TABLET | Freq: Every day | ORAL | Status: DC

## 2016-03-12 MED ORDER — TEMAZEPAM 15 MG PO CAPS: 15.0000 mg | ORAL_CAPSULE | Freq: Every day | ORAL | Status: DC

## 2016-03-12 MED ORDER — TRAMADOL HCL 50 MG PO TABS
50.0000 mg | ORAL_TABLET | Freq: Four times a day (QID) | ORAL | Status: DC | PRN
  Administered 2016-03-12 (×2): 50 mg via ORAL
  Filled 2016-03-12 (×2): qty 1

## 2016-03-12 MED ORDER — TRAMADOL HCL 50 MG PO TABS: 50.0000 mg | ORAL_TABLET | Freq: Four times a day (QID) | ORAL | Status: DC | PRN

## 2016-03-12 MED ORDER — QUETIAPINE FUMARATE 25 MG PO TABS: 25.0000 mg | ORAL_TABLET | Freq: Three times a day (TID) | ORAL | Status: DC

## 2016-03-12 NOTE — Progress Notes (Signed)
Continues with 1:1. No negative behaviors to report

## 2016-03-12 NOTE — Progress Notes (Signed)
  [facility] :  Will you be returning to the same living situation after discharge:  Yes,  home  At discharge, do you have transportation home?: Yes,  family  Do you have the ability to pay for your medications: Yes,  Insurance   Release of information consent forms completed and in the chart;  Patient's signature needed at discharge.  Patient to Follow up at: Follow-up Information    Go to [facility] .   Why:  Please arrive to the walk-in clinic between the hours of 8:00am and 10:00am for an assessment for medication management and therapy.  Arrive as early as possible for prompt service.  Please call Unk PintoHarvey Bryant at [phone] for questions and assistance.   Contact information:   [address] [place] [address] Ph: [phone] Fax: [phone]      Next level of care provider has [facility] to [facility] Link:no  Safety Planning and Suicide Prevention discussed: Yes,  With patient and mother   Have you used any form of tobacco in the last 30 days? (Cigarettes, Smokeless Tobacco, Cigars, and/or Pipes): Yes  Has patient been referred to the Quitline?: Patient refused referral  Patient has been referred for addiction treatment: Pt. refused referral  Rondall Allegraandace L Shallyn Constancio MSW, LCSWA  03/12/2016, 2:38 PM

## 2016-03-12 NOTE — Progress Notes (Signed)
Sleeping. 1:1 present

## 2016-03-12 NOTE — Progress Notes (Signed)
Sleeping. 1:1 present 

## 2016-03-12 NOTE — Progress Notes (Signed)
Pt in bed awake. Sitter present. 

## 2016-03-12 NOTE — Progress Notes (Signed)
Pt in room. Sitter present.

## 2016-03-12 NOTE — Progress Notes (Signed)
1:1 for safety. No negative behaviors, calm at present.

## 2016-03-12 NOTE — [facility] Suicide Risk Assessment (Signed)
[facility]   Principal Problem: Major depressive disorder, recurrent, severe with psychotic features [facility](HCC) Discharge Diagnoses:  Patient Active Problem List   Diagnosis Date Noted  . Tobacco use disorder [F17.200] 03/10/2016  . Cannabis use disorder, mild, abuse [F12.10] 03/10/2016  . Suicidal ideation [R45.851] 03/09/2016  . Alcohol use disorder, mild, abuse [F10.10] 03/09/2016  . Major depressive disorder, recurrent, severe with psychotic features (HCC) [F33.3] 03/09/2016  . Mild cardiomegaly [I51.7] 07/13/2015  . Renal insufficiency [N28.9] 07/13/2015    Total Time spent with patient: 30 minutes  Musculoskeletal: Strength & Muscle Tone: within normal limits Gait & Station: normal Patient leans: N/A  Psychiatric Specialty Exam: Review of Systems  Psychiatric/Behavioral: Positive for hallucinations.  All other systems reviewed and are negative.   Blood pressure 120/67, pulse 74, temperature 97.6 F (36.4 C), temperature source Oral, resp. rate 18, height 5\' 4"  (1.626 m), weight 87.998 kg (194 lb), SpO2 98 %, unknown if currently breastfeeding.Body mass index is 33.28 kg/(m^2).  General Appearance: Casual  Eye Contact::  Good  Speech:  Clear and Coherent409  Volume:  Normal  Mood:  Euphoric  Affect:  Appropriate  Thought Process:  Goal Directed  Orientation:  Full (Time, Place, and Person)  Thought Content:  Hallucinations: Auditory  Suicidal Thoughts:  No  Homicidal Thoughts:  No  Memory:  Immediate;   Fair Recent;   Fair Remote;   Fair  Judgement:  Impaired  Insight:  Shallow  Psychomotor Activity:  Normal  Concentration:  Fair  Recall:  [employer]  Fund of Knowledge:Fair  Language: Fair  Akathisia:  No  Handed:  Right  AIMS (if indicated):     Assets:  Communication Skills Desire for Improvement Financial Resources/Insurance Housing Intimacy Physical Health Resilience Social Support  Sleep:     Cognition: WNL  ADL's:  Intact    Mental Status Per Nursing Assessment::   On Admission:     Demographic Factors:  NA  Loss Factors: NA  Historical Factors: Impulsivity  Risk Reduction Factors:   Responsible for children under 23 years of age, Sense of responsibility to family, Living with another person, especially a relative and Positive social support  Continued Clinical Symptoms:  Depression:   Impulsivity Currently Psychotic  Cognitive Features That Contribute To Risk:  None    Suicide Risk:  Minimal: No identifiable suicidal ideation.  Patients presenting with no risk factors but with morbid ruminations; may be classified as minimal risk based on the severity of the depressive symptoms  Follow-up Information    Go to [facility] .   Why:  Please arrive to the walk-in clinic between the hours of 8:00am and 10:00am for an assessment for medication management and therapy.  Arrive as early as possible for prompt service.  Please call Unk PintoHarvey Bryant at [phone] for questions and assistance.   Contact information:   [address] [place] [address] Ph: [phone] Fax: [phone]      Plan Of Care/Follow-up recommendations:  Activity:  As tolerated. Diet:  Regular. Other:  Keep follow-up appointments.  Kristine LineaJolanta Pucilowska, MD 03/12/2016, 2:52 PM

## 2016-03-12 NOTE — Progress Notes (Signed)
Sleeping. Sitter present 

## 2016-03-12 NOTE — Progress Notes (Signed)
Pt in bed awake. Sitter present.

## 2016-03-12 NOTE — Tx Team (Signed)
Interdisciplinary Treatment Plan Update (Adult)  Date:  03/12/2016 Time Reviewed:  2:32 PM  Progress in Treatment: Attending groups: Yes. Participating in groups:  Yes. Taking medication as prescribed:  Yes. Tolerating medication:  Yes. Family/Significant othe contact made:  Yes, individual(s) contacted:  mother Patient understands diagnosis:  No. and As evidenced by:  Limited insight  Discussing patient identified problems/goals with staff:  Yes. Medical problems stabilized or resolved:  Yes. Denies suicidal/homicidal ideation: Yes. Issues/concerns per patient self-inventory:  Yes. Other:  New problem(s) identified: Yes, Describe:  NA  Discharge Plan or Barriers: Pt plans to return home and follow up with outpatient.    Reason for Continuation of Hospitalization: Depression Hallucinations Medication stabilization Suicidal ideation  Comments:The patient has a history of depression and was hospitalized at [facility] at the age of 23. The patient reports that for the past month she has been feeling increasingly depressed crying all the time unable to sleep. She started experiencing auditory hallucinations. Sometimes the voices make her laugh at other times they are angry and tell her that that her children are the children of the Devil. The voices have been growing louder and meaner. They started telling her to hurt herself and her mother. She has no plans intentions or thoughts of doing so but has been rather distraught by the voices. On the day of admission she went to [facility] to establish care but reportedly was giggling inappropriately and annoying in the grass. She was agitated in the emergency room and was given Geodon injection. At the patient reports depression for sleep, decreased appetite, anhedonia, feeling of guilt and hopelessness worthlessness, poor energy and concentration social isolation, crying spells derogatory auditory hallucinations, and suicidal and homicidal thoughts. She  reports heightened anxiety. She has been "popping her neck"constantly. She worries excessively. She has been smoking marijuana occasionally. She is not a drinker but not this that in the past month her alcohol use escalated  Estimated length of stay: Possible d/c today   New goal(s):  Review of initial/current patient goals per problem list:   1.  Goal(s): Patient will participate in aftercare plan * Met: Yes * Target date: at discharge * As evidenced by: Patient will participate within aftercare plan AEB aftercare provider and housing plan at discharge being identified.   2.  Goal (s): Patient will exhibit decreased depressive symptoms and suicidal ideations. * Met: Yes *  Target date: at discharge * As evidenced by: Patient will utilize self rating of depression at 3 or below and demonstrate decreased signs of depression or be deemed stable for discharge by MD.   3.  Goal(s): Patient will demonstrate decreased signs and symptoms of anxiety. * Met: Yes * Target date: at discharge * As evidenced by: Patient will utilize self rating of anxiety at 3 or below and demonstrated decreased signs of anxiety, or be deemed stable for discharge by MD   4.  Goal(s): Patient will demonstrate decreased signs of withdrawal due to substance abuse * Met: Yes * Target date: at discharge * As evidenced by: Patient will produce a CIWA/COWS score of 0, have stable vitals signs, and no symptoms of withdrawal.  5.  Goal (s): Patient will demonstrate decreased symptoms of psychosis. * Met: Yes *  Target date: at discharge * As evidenced by: Patient will not endorse signs of psychosis or be deemed stable for discharge by MD.   Attendees: Patient:  Juel Burrow 9/3/57017:79 PM  Family:   5/7/20172:32 PM  Physician:  Dr. Bary Leriche  5/7/20172:32 PM  Nursing:   Floyde Parkins, RN  5/7/20172:32 PM  Case Manager:   5/7/20172:32 PM  Counselor:   5/7/20172:32 PM  Other:  Wray Kearns, LCSWA 5/7/20172:32  PM  Other:   5/7/20172:32 PM  Other:   5/7/20172:32 PM  Other:  5/7/20172:32 PM  Other:  5/7/20172:32 PM  Other:  5/7/20172:32 PM  Other:  5/7/20172:32 PM  Other:  5/7/20172:32 PM  Other:  5/7/20172:32 PM  Other:   5/7/20172:32 PM   Scribe for Treatment Team:   Stryker Veasey L Asani Mcburney,MSW, [place]  03/12/2016, 2:32 PM

## 2016-03-12 NOTE — Progress Notes (Signed)
Pt in bed resting with eyes closed. 1:1 present.

## 2016-03-12 NOTE — Progress Notes (Signed)
Pt reports feeling anxious, reports sons birthday and would like to go home.

## 2016-03-12 NOTE — Progress Notes (Addendum)
Poured a cup of ice on floor in front of nurses station, it appeared she did this purposefully but Pt denies this. She stated that this writer told her that MD was not here when "she was earlier".   During med pass Pt requested to speak to Probation officer privately. She stated that she had been hearing voices telling her to grab and kiss someone she had just met, a female.; that the person may like it. Pt appeared to be trying to intimidate this Probation officer. Pt was redirected successfully. Pt boyfriend attempted to visit and was not allowed on unit. Later on phone with boyfriend she was yelling and slamming phone down due to her belief that he had someone else in her house. Phones were turned off.  Denies SI. Endorses [facility]. Labile, irritable and at times sexually inappropriate and intrusive. Medication compliant. Q15 minute checks maintained for safety. Sitter present.

## 2016-03-12 NOTE — [facility] Suicide Risk Assessment (Signed)
[facility] INPATIENT:  Family/Significant Other Suicide Prevention Education  Suicide Prevention Education:  Education Completed; Larene Pickettngela Ibe ([phone] Mother  has been identified by the patient as the family member/significant other with whom the patient will be residing, and identified as the person(s) who will aid the patient in the event of a mental health crisis (suicidal ideations/suicide attempt).  With written consent from the patient, the family member/significant other has been provided the following suicide prevention education, prior to the and/or following the discharge of the patient.  The suicide prevention education provided includes the following:  Suicide risk factors  Suicide prevention and interventions  National Suicide Hotline telephone number  [facility] assessment telephone number  [facility] Emergency Assistance 911  [facility] and/or Residential Mobile Crisis Unit telephone number  Request made of family/significant other to:  Remove weapons (e.g., guns, rifles, knives), all items previously/currently identified as safety concern.    Remove drugs/medications (over-the-counter, prescriptions, illicit drugs), all items previously/currently identified as a safety concern.  The family member/significant other verbalizes understanding of the suicide prevention education information provided.  The family member/significant other agrees to remove the items of safety concern listed above.  Auren Valdes L Camil Wilhelmsen MSW, LCSWA  03/12/2016, 2:26 PM

## 2016-03-12 NOTE — Plan of Care (Signed)
Problem: Ineffective individual coping Goal: LTG: Patient will report a decrease in negative feelings Outcome: Progressing Pt reports feeling better Goal: STG: Pt will be able to identify effective and ineffective STG: Pt will be able to identify effective and ineffective coping patterns  Outcome: Progressing Pt verbalized inappropriate behaviors

## 2016-03-12 NOTE — Progress Notes (Signed)
In bed, awake. 1:1 present.

## 2016-03-12 NOTE — Progress Notes (Signed)
Pt pleasant and cooperative. No behaviors noted.

## 2016-03-12 NOTE — Progress Notes (Signed)
Pt continues with 1:1, no negative behaviors to report
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
## 2016-03-12 NOTE — Progress Notes (Addendum)
Patient in dayroom eating breakfast. Continues with !:1 for safety. No negative behaviors noted. Eating meals  Interacting with staff and peers appropriately.

## 2016-03-12 NOTE — Progress Notes (Addendum)
Patient discharged home. DC instructions provided and explained. Medications reviewed. Rx given. All questions answered. Pt stable at discharge. Denies SI, HI, AVH. Pt reports she will use coping skills when voices return

## 2016-09-12 ENCOUNTER — Encounter: Payer: Self-pay | Admitting: Emergency Medicine

## 2016-09-12 ENCOUNTER — Emergency Department: Payer: Medicaid Other

## 2016-09-12 ENCOUNTER — Emergency Department
Admission: EM | Admit: 2016-09-12 | Discharge: 2016-09-12 | Disposition: A | Discharge status: Home / Self Care | Payer: Medicaid Other | Attending: Emergency Medicine | Admitting: Emergency Medicine

## 2016-09-12 DIAGNOSIS — Z79899 Other long term (current) drug therapy: Secondary | ICD-10-CM | POA: Insufficient documentation

## 2016-09-12 DIAGNOSIS — F17210 Nicotine dependence, cigarettes, uncomplicated: Secondary | ICD-10-CM | POA: Diagnosis not present

## 2016-09-12 DIAGNOSIS — Y929 Unspecified place or not applicable: Secondary | ICD-10-CM | POA: Insufficient documentation

## 2016-09-12 DIAGNOSIS — Y999 Unspecified external cause status: Secondary | ICD-10-CM | POA: Insufficient documentation

## 2016-09-12 DIAGNOSIS — S8002XA Contusion of left knee, initial encounter: Secondary | ICD-10-CM | POA: Diagnosis not present

## 2016-09-12 DIAGNOSIS — Y9389 Activity, other specified: Secondary | ICD-10-CM | POA: Insufficient documentation

## 2016-09-12 DIAGNOSIS — S80212A Abrasion, left knee, initial encounter: Secondary | ICD-10-CM

## 2016-09-12 DIAGNOSIS — M25562 Pain in left knee: Secondary | ICD-10-CM | POA: Diagnosis present

## 2016-09-12 DIAGNOSIS — M25569 Pain in unspecified knee: Secondary | ICD-10-CM

## 2016-09-12 MED ORDER — MELOXICAM 15 MG PO TABS: 15.0000 mg | ORAL_TABLET | Freq: Every day | ORAL | 0 refills | Status: DC
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
## 2016-09-12 NOTE — Discharge Instructions (Signed)
Please continue to clean skin daily, applying antibiotic ointment daily. Use crutches as needed for walking . Ice and elevate the knee. Follow-up with orthopedics if no improvement in 1 week. Take meloxicam as needed for pain.
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
## 2016-11-02 ENCOUNTER — Emergency Department: Payer: Medicaid Other

## 2016-11-02 ENCOUNTER — Emergency Department
Admission: EM | Admit: 2016-11-02 | Discharge: 2016-11-02 | Disposition: A | Discharge status: Home / Self Care | Payer: Medicaid Other | Attending: Emergency Medicine | Admitting: Emergency Medicine

## 2016-11-02 ENCOUNTER — Encounter: Payer: Self-pay | Admitting: Emergency Medicine

## 2016-11-02 DIAGNOSIS — H6982 Other specified disorders of Eustachian tube, left ear: Secondary | ICD-10-CM

## 2016-11-02 DIAGNOSIS — Z791 Long term (current) use of non-steroidal anti-inflammatories (NSAID): Secondary | ICD-10-CM | POA: Diagnosis not present

## 2016-11-02 DIAGNOSIS — F17210 Nicotine dependence, cigarettes, uncomplicated: Secondary | ICD-10-CM | POA: Diagnosis not present

## 2016-11-02 DIAGNOSIS — B9789 Other viral agents as the cause of diseases classified elsewhere: Secondary | ICD-10-CM

## 2016-11-02 DIAGNOSIS — H6992 Unspecified Eustachian tube disorder, left ear: Secondary | ICD-10-CM | POA: Insufficient documentation

## 2016-11-02 DIAGNOSIS — J069 Acute upper respiratory infection, unspecified: Secondary | ICD-10-CM

## 2016-11-02 DIAGNOSIS — R05 Cough: Secondary | ICD-10-CM | POA: Diagnosis present

## 2016-11-02 DIAGNOSIS — Z79899 Other long term (current) drug therapy: Secondary | ICD-10-CM | POA: Diagnosis not present

## 2016-11-02 MED ORDER — PREDNISONE 20 MG PO TABS
40.0000 mg | ORAL_TABLET | Freq: Once | ORAL | Status: AC
  Administered 2016-11-02: 40 mg via ORAL
  Filled 2016-11-02: qty 2

## 2016-11-02 MED ORDER — HYDROCOD POLST-CPM POLST ER 10-8 MG/5ML PO SUER: 5.0000 mL | Freq: Two times a day (BID) | ORAL | 0 refills | Status: DC

## 2016-11-02 MED ORDER — METHYLPREDNISOLONE 4 MG PO TBPK: ORAL_TABLET | ORAL | 0 refills | Status: DC

## 2016-11-02 MED ORDER — HYDROCOD POLST-CPM POLST ER 10-8 MG/5ML PO SUER
5.0000 mL | Freq: Once | ORAL | Status: AC
  Administered 2016-11-02: 5 mL via ORAL
  Filled 2016-11-02: qty 5

## 2016-11-02 NOTE — ED Notes (Signed)
Pt discharged to home.  Discharge instructions reviewed.  Verbalized understanding.  No questions or concerns at this time.  Teach back verified.  Pt in NAD.  No items left in ED.   

## 2016-11-02 NOTE — ED Provider Notes (Signed)
[facility] Emergency Department Provider Note   ____________________________________________   First MD Initiated Contact with Patient 11/02/16 0532     (approximate)  I have reviewed the triage vital signs and the nursing notes.   HISTORY  Chief Complaint Cough    HPI Mary Terry is a 25 y.o. female who presents to the ED from home with a chief complaint of cold-like symptoms. Patient reports a 5 day history of body aches, cough productive of yellow sputum, nasal congestion and left ear pain. Taking over-the-counter TheraFlu and Mucinex without relief. Denies associated fever, chills, chest pain, shortness of breath, abdominal pain, nausea, vomiting, diarrhea. Denies recent travel or trauma. Nothing makes her symptoms better or worse.   Past Medical History:  Diagnosis Date  . Herpes genitalis   . History of substance abuse   . Hx of preeclampsia, prior pregnancy, currently pregnant     Patient Active Problem List   Diagnosis Date Noted  . Tobacco use disorder 03/10/2016  . Cannabis use disorder, mild, abuse 03/10/2016  . Suicidal ideation 03/09/2016  . Alcohol use disorder, mild, abuse 03/09/2016  . Major depressive disorder, recurrent, 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  . Mild cardiomegaly 07/13/2015  . Renal insufficiency 07/13/2015    Past Surgical History:  Procedure Laterality Date  . NO PAST SURGERIES      Prior to Admission medications   Medication Sig Start Date End Date Taking? Authorizing Provider  fluvoxaMINE (LUVOX) 100 MG tablet Take 1 tablet (100 mg total) by mouth at bedtime. 03/12/16   Shari ProwsJolanta B Pucilowska, MD  meloxicam (MOBIC) 15 MG tablet Take 1 tablet (15 mg total) by mouth daily. 09/12/16   Evon Slackhomas C Gaines, PA-C  methocarbamol (ROBAXIN) 750 MG tablet Take 1 tablet (750 mg total) by mouth 3 (three) times daily. 03/12/16   Shari ProwsJolanta B Pucilowska, MD  QUEtiapine (SEROQUEL) 200 MG tablet Take 1 tablet (200 mg  total) by mouth at bedtime. 03/12/16   Shari ProwsJolanta B Pucilowska, MD  QUEtiapine (SEROQUEL) 25 MG tablet Take 1 tablet (25 mg total) by mouth 3 (three) times daily. 03/12/16   Jolanta B Pucilowska, MD  temazepam (RESTORIL) 15 MG capsule Take 1 capsule (15 mg total) by mouth at bedtime. 03/12/16   Shari ProwsJolanta B Pucilowska, MD  traMADol (ULTRAM) 50 MG tablet Take 1 tablet (50 mg total) by mouth every 6 (six) hours as needed (pain). 03/12/16   Shari ProwsJolanta B Pucilowska, MD    Allergies Percocet [oxycodone-acetaminophen]  Family History  Problem Relation Age of Onset  . Family history unknown: Yes    Social History Social History  Substance Use Topics  . Smoking status: Current Every Day Smoker    Packs/day: 0.50    Years: 3.00    Types: Cigarettes  . Smokeless tobacco: Never Used  . Alcohol use No    Review of Systems  Constitutional: Positive for body aches. No fever/chills. Eyes: No visual changes. ENT: Positive for nasal congestion. Positive for left ear pain. No sore throat. Cardiovascular: Denies chest pain. Respiratory: Positive for cough. Denies shortness of breath. Gastrointestinal: No abdominal pain.  No nausea, no vomiting.  No diarrhea.  No constipation. Genitourinary: Negative for dysuria. Musculoskeletal: Negative for back pain. Skin: Negative for rash. Neurological: Negative for headaches, focal weakness or numbness.  10-point ROS otherwise negative.  ____________________________________________   PHYSICAL EXAM:  VITAL SIGNS: ED Triage Vitals  Enc Vitals Group     BP 11/02/16 0414 126/89     Pulse  Rate 11/02/16 0414 (!) 105     Resp 11/02/16 0414 20     Temp 11/02/16 0414 98.1 F (36.7 C)     Temp Source 11/02/16 0414 Oral     SpO2 11/02/16 0414 99 %     Weight 11/02/16 0414 196 lb (88.9 kg)     Height 11/02/16 0414 5\' 4"  (1.626 m)     Head Circumference --      Peak Flow --      Pain Score 11/02/16 0415 7     Pain Loc --      Pain Edu? --      Excl. in GC? --      Constitutional: Alert and oriented. Well appearing and in no acute distress. Eyes: Conjunctivae are normal. PERRL. EOMI. Head: Atraumatic. Ears: Left TM with mild to moderate fluid. Nose: No congestion/rhinnorhea. Mouth/Throat: Mucous membranes are moist.  Oropharynx non-erythematous. Neck: No stridor.   Cardiovascular: Normal rate, regular rhythm. Grossly normal heart sounds.  Good peripheral circulation. Respiratory: Normal respiratory effort.  No retractions. Lungs CTAB. Active dry cough. Gastrointestinal: Soft and nontender. No distention. No abdominal bruits. No CVA tenderness. Musculoskeletal: No lower extremity tenderness nor edema.  No joint effusions. Neurologic:  Normal speech and language. No gross focal neurologic deficits are appreciated. No gait instability. Skin:  Skin is warm, dry and intact. No rash noted. Psychiatric: Mood and affect are normal. Speech and behavior are normal.  ____________________________________________   LABS (all labs ordered are listed, but only abnormal results are displayed)  Labs Reviewed - No data to display ____________________________________________  EKG  None ____________________________________________  RADIOLOGY  Chest 2 view (viewed by me, interpreted per Dr. Manus GunningEhinger): No acute abnormality. ____________________________________________   PROCEDURES  Procedure(s) performed: None  Procedures  Critical Care performed: No  ____________________________________________   INITIAL IMPRESSION / ASSESSMENT AND PLAN / ED COURSE  Pertinent labs & imaging results that were available during my care of the patient were reviewed by me and considered in my medical decision making (see chart for details).  25 year old female who presents with viral URI symptoms. Will treat with Tussionex and patient will follow-up with her PCP. Strict return precautions given. Patient verbalizes understanding and agrees with plan of  care.  Clinical Course      ____________________________________________   FINAL CLINICAL IMPRESSION(S) / ED DIAGNOSES  Final diagnoses:  Viral URI with cough  Eustachian tube dysfunction, left      NEW MEDICATIONS STARTED DURING THIS VISIT:  New Prescriptions   No medications on file     Note:  This document was prepared using Dragon voice recognition software and may include unintentional dictation errors.    Irean HongJade J Sung, MD 11/02/16 [phone]

## 2016-11-02 NOTE — Discharge Instructions (Signed)
1. Take steroid taper as prescribed (Medrol Dosepak). 2. You may take cough medicine as needed (Tussionex). 3. Return to the ER for worsening symptoms, persistent vomiting, difficult breathing or other concerns.

## 2016-11-02 NOTE — ED Triage Notes (Signed)
Pt ambulatory to triage with steady gait with c/o cough, nasal congestion, and generalize body aches x 5 days. Pt denies fever at home. Pt reports taking Theraflu and Muccinex without relief. Pt reports coughing up yellow sputum.

## 2016-11-13 ENCOUNTER — Emergency Department (HOSPITAL_COMMUNITY)
Admission: EM | Admit: 2016-11-13 | Discharge: 2016-11-13 | Disposition: A | Discharge status: Home / Self Care | Payer: Medicaid Other | Attending: Emergency Medicine | Admitting: Emergency Medicine

## 2016-11-13 ENCOUNTER — Encounter (HOSPITAL_COMMUNITY): Payer: Self-pay | Admitting: Emergency Medicine

## 2016-11-13 ENCOUNTER — Emergency Department (HOSPITAL_COMMUNITY): Payer: Medicaid Other

## 2016-11-13 DIAGNOSIS — K5903 Drug induced constipation: Secondary | ICD-10-CM | POA: Insufficient documentation

## 2016-11-13 DIAGNOSIS — T402X5A Adverse effect of other opioids, initial encounter: Secondary | ICD-10-CM | POA: Diagnosis not present

## 2016-11-13 DIAGNOSIS — F1290 Cannabis use, unspecified, uncomplicated: Secondary | ICD-10-CM | POA: Insufficient documentation

## 2016-11-13 DIAGNOSIS — Z87891 Personal history of nicotine dependence: Secondary | ICD-10-CM | POA: Diagnosis not present

## 2016-11-13 DIAGNOSIS — R195 Other fecal abnormalities: Secondary | ICD-10-CM | POA: Diagnosis present

## 2016-11-13 DIAGNOSIS — Z79899 Other long term (current) drug therapy: Secondary | ICD-10-CM | POA: Insufficient documentation

## 2016-11-13 DIAGNOSIS — R110 Nausea: Secondary | ICD-10-CM | POA: Diagnosis not present

## 2016-11-13 DIAGNOSIS — R109 Unspecified abdominal pain: Secondary | ICD-10-CM | POA: Diagnosis not present

## 2016-11-13 LAB — BASIC METABOLIC PANEL
ANION GAP: 7 (ref 5–15)
BUN: 8 mg/dL (ref 6–20)
CALCIUM: 9.1 mg/dL (ref 8.9–10.3)
CO2: 23 mmol/L (ref 22–32)
Chloride: 106 mmol/L (ref 101–111)
Creatinine, Ser: 0.83 mg/dL (ref 0.44–1.00)
GFR calc Af Amer: >60 mL/min (ref >60)
GLUCOSE: 93 mg/dL (ref 65–99)
Potassium: 3.8 mmol/L (ref 3.5–5.1)
SODIUM: 136 mmol/L (ref 135–145)

## 2016-11-13 LAB — CBC WITH DIFFERENTIAL/PLATELET
BASOS ABS: 0 10*3/uL (ref 0.0–0.1)
BASOS PCT: 0 %
EOS ABS: 0.4 10*3/uL (ref 0.0–0.7)
EOS PCT: 3 %
HCT: 41.1 % (ref 36.0–46.0)
Hemoglobin: 13.9 g/dL (ref 12.0–15.0)
LYMPHS PCT: 27 %
Lymphs Abs: 3.6 10*3/uL (ref 0.7–4.0)
MCH: 29.3 pg (ref 26.0–34.0)
MCHC: 33.8 g/dL (ref 30.0–36.0)
MCV: 86.5 fL (ref 78.0–100.0)
MONO ABS: 1 10*3/uL (ref 0.1–1.0)
Monocytes Relative: 7 %
Neutro Abs: 8.2 10*3/uL — ABNORMAL HIGH (ref 1.7–7.7)
Neutrophils Relative %: 63 %
Platelets: 329 10*3/uL (ref 150–400)
RBC: 4.75 MIL/uL (ref 3.87–5.11)
RDW: 14 % (ref 11.5–15.5)
WBC: 13.2 10*3/uL — AB (ref 4.0–10.5)

## 2016-11-13 LAB — URINALYSIS, ROUTINE W REFLEX MICROSCOPIC
Bilirubin Urine: NEGATIVE
GLUCOSE, UA: NEGATIVE mg/dL
HGB URINE DIPSTICK: NEGATIVE
Ketones, ur: NEGATIVE mg/dL
LEUKOCYTES UA: NEGATIVE
Nitrite: NEGATIVE
Protein, ur: NEGATIVE mg/dL
Specific Gravity, Urine: 1.019 (ref 1.005–1.030)
pH: 6 (ref 5.0–8.0)

## 2016-11-13 LAB — PREGNANCY, URINE: PREG TEST UR: NEGATIVE

## 2016-11-13 LAB — POC OCCULT BLOOD, ED: FECAL OCCULT BLD: NEGATIVE

## 2016-11-13 MED ORDER — HYDROCORTISONE 2.5 % RE CREA: TOPICAL_CREAM | RECTAL | 0 refills | Status: DC

## 2016-11-13 NOTE — ED Provider Notes (Signed)
[facility]-EMERGENCY DEPT Provider Note   CSN: [id number] Arrival date & time: 11/13/16  1631   By signing my name below, I, Cynda Acres, attest that this documentation has been prepared under the direction and in the presence of Cranford Blessinger PA-C.  Electronically Signed: Cynda Acres, Scribe. 11/13/16. 8:00 PM.  History   Chief Complaint Chief Complaint  Patient presents with  . Blood In Stools    HPI Comments: Mary Terry is a 26 y.o. female who presents to the Emergency Department complaining of blood in the stool that began 4 days ago. Patient states the stool is hard with streaks of bright red blood during every bowel movement. Patient has associated nausea, bloating, flatus, constipation, and crampy abdominal pain. Her last bowel movement was earlier this morning. She has been taking hydrocodone daily, in which she was prescribed for a cold. No modifying factors indicated. She denies any vomiting or fever, dysuria. Chest pain or shortness of breath  The history is provided by the patient. No language interpreter was used.    Past Medical History:  Diagnosis Date  . Herpes genitalis   . History of substance abuse   . Hx of preeclampsia, prior pregnancy, currently pregnant     Patient Active Problem List   Diagnosis Date Noted  . Tobacco use disorder 03/10/2016  . Cannabis use disorder, mild, abuse 03/10/2016  . Suicidal ideation 03/09/2016  . Alcohol use disorder, mild, abuse 03/09/2016  . Major depressive disorder, recurrent, 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  . Mild cardiomegaly 07/13/2015  . Renal insufficiency 07/13/2015    Past Surgical History:  Procedure Laterality Date  . NO PAST SURGERIES      OB History    Gravida Para Term Preterm AB Living   3 3 3  0 0 3   SAB TAB Ectopic Multiple Live Births   0 0 0 0 3       Home Medications    Prior to Admission medications   Medication Sig Start Date End Date Taking? Authorizing Provider    chlorpheniramine-HYDROcodone (TUSSIONEX PENNKINETIC ER) 10-8 MG/5ML SUER Take 5 mLs by mouth 2 (two) times daily. 11/02/16   Irean Hong, MD  fluvoxaMINE (LUVOX) 100 MG tablet Take 1 tablet (100 mg total) by mouth at bedtime. 03/12/16   Shari Prows, MD  meloxicam (MOBIC) 15 MG tablet Take 1 tablet (15 mg total) by mouth daily. 09/12/16   Evon Slack, PA-C  methocarbamol (ROBAXIN) 750 MG tablet Take 1 tablet (750 mg total) by mouth 3 (three) times daily. 03/12/16   Shari Prows, MD  methylPREDNISolone (MEDROL DOSEPAK) 4 MG TBPK tablet Take as directed 11/02/16   Irean Hong, MD  QUEtiapine (SEROQUEL) 200 MG tablet Take 1 tablet (200 mg total) by mouth at bedtime. 03/12/16   Shari Prows, MD  QUEtiapine (SEROQUEL) 25 MG tablet Take 1 tablet (25 mg total) by mouth 3 (three) times daily. 03/12/16   Jolanta B Pucilowska, MD  temazepam (RESTORIL) 15 MG capsule Take 1 capsule (15 mg total) by mouth at bedtime. 03/12/16   Shari Prows, MD  traMADol (ULTRAM) 50 MG tablet Take 1 tablet (50 mg total) by mouth every 6 (six) hours as needed (pain). 03/12/16   Shari Prows, MD    Family History Family History  Problem Relation Age of Onset  . Family history unknown: Yes    Social History Social History  Substance Use Topics  . Smoking status: Former Smoker  Packs/day: 0.50    Years: 3.00    Types: Cigarettes    Quit date: 11/06/2016  . Smokeless tobacco: Never Used  . Alcohol use No     Allergies   Percocet [oxycodone-acetaminophen]   Review of Systems Review of Systems  Constitutional: Negative for appetite change, chills and fever.  Respiratory: Negative for chest tightness and shortness of breath.   Cardiovascular: Negative for chest pain.  Gastrointestinal: Positive for abdominal pain, blood in stool, constipation and nausea. Negative for diarrhea and vomiting.  Genitourinary: Negative for dysuria and flank pain.  Musculoskeletal: Negative for back  pain.  Skin: Negative for rash.  Neurological: Negative for weakness.     Physical Exam Updated Vital Signs BP 121/90 (BP Location: Left Arm)   Pulse 91   Temp 98.1 F (36.7 C) (Oral)   Resp 16   Ht 5\' 4"  (1.626 m)   Wt 196 lb (88.9 kg)   LMP 10/19/2016   SpO2 100%   BMI 33.64 kg/m   Physical Exam  Constitutional: She is oriented to person, place, and time. She appears well-developed and well-nourished. No distress.  HENT:  Head: Normocephalic and atraumatic.  Mouth/Throat: Oropharynx is clear and moist.  Eyes: EOM are normal. Pupils are equal, round, and reactive to light.  Neck: Normal range of motion. Neck supple.  Cardiovascular: Normal rate and regular rhythm.   Pulmonary/Chest: Effort normal and breath sounds normal. No respiratory distress.  Abdominal: Soft. She exhibits no distension. There is no tenderness. There is no rebound and no guarding.  Genitourinary:  Genitourinary Comments: Tenderness on exam with a small fissure to the anus. Hemoccult negative. No palpable masses on digital rectal exam.   Musculoskeletal: Normal range of motion.  Neurological: She is alert and oriented to person, place, and time.  Skin: Skin is warm and dry.  Psychiatric: She has a normal mood and affect.  Nursing note and vitals reviewed.    ED Treatments / Results  DIAGNOSTIC STUDIES: Oxygen Saturation is 100% on RA, normal by my interpretation.    COORDINATION OF CARE: 7:58 PM Discussed treatment plan with pt at bedside and pt agreed to plan.  Labs (all labs ordered are listed, but only abnormal results are displayed) Labs Reviewed  CBC WITH DIFFERENTIAL/PLATELET - Abnormal; Notable for the following:       Result Value   WBC 13.2 (*)    Neutro Abs 8.2 (*)    All other components within normal limits  URINALYSIS, ROUTINE W REFLEX MICROSCOPIC - Abnormal; Notable for the following:    APPearance HAZY (*)    All other components within normal limits  BASIC METABOLIC PANEL    PREGNANCY, URINE  POC OCCULT BLOOD, ED    EKG  EKG Interpretation None       Radiology No results found.  Procedures Procedures (including critical care time)  Medications Ordered in ED Medications - No data to display   Initial Impression / Assessment and Plan / ED Course  I have reviewed the triage vital signs and the nursing notes.  Pertinent labs & imaging results that were available during my care of the patient were reviewed by me and considered in my medical decision making (see chart for details).  Clinical Course     Pt is well appearing.  Vitals stable.  Labs reassuring.  Elevated wht count likely related to recent steroids. And constipation felt to be secondary to recent narcotic cough syrup.  abd is soft, NT.  Small fissure and  recent constipation is felt to be source of the reported hematochezia.  Pt appear stable for d/c, agrees to increase water intake, miralax, or dolculax for her constipation and rx for anusol HC cream.  Advised to d/c the narcotic.  Strict ER return precautions also given.      Final Clinical Impressions(s) / ED Diagnoses   Final diagnoses:  Drug-induced constipation    New Prescriptions New Prescriptions   No medications on file   I personally performed the services described in this documentation, which was scribed in my presence. The recorded information has been reviewed and is accurate.    Pauline Ausammy Bedie Dominey, PA-C 11/17/16 1456    Maia PlanJoshua G Long, MD 11/18/16 1048

## 2016-11-13 NOTE — ED Triage Notes (Signed)
PT c/o upper abdominal pain and hard stools with blood noted in bowel movements with nausea x4 days. PT denies any urinary symptoms.

## 2016-11-13 NOTE — Discharge Instructions (Signed)
Drink plenty of water, increase your fiber intake.  You can try an OTC laxative such as dolculax or miralax as directed for relief of constipation. Sitz baths may also help.  Return to ER for any worsening symptoms such as fever, vomiting, or increasing abdominal pain

## 2016-11-18 ENCOUNTER — Encounter: Payer: Self-pay | Admitting: Urgent Care

## 2016-11-18 ENCOUNTER — Other Ambulatory Visit: Payer: Self-pay

## 2016-11-18 ENCOUNTER — Emergency Department
Admission: EM | Admit: 2016-11-18 | Discharge: 2016-11-19 | Disposition: A | Discharge status: Home / Self Care | Payer: Medicaid Other | Attending: Emergency Medicine | Admitting: Emergency Medicine

## 2016-11-18 DIAGNOSIS — R002 Palpitations: Secondary | ICD-10-CM | POA: Diagnosis not present

## 2016-11-18 DIAGNOSIS — R079 Chest pain, unspecified: Secondary | ICD-10-CM

## 2016-11-18 DIAGNOSIS — R519 Headache, unspecified: Secondary | ICD-10-CM

## 2016-11-18 DIAGNOSIS — R51 Headache: Secondary | ICD-10-CM | POA: Diagnosis not present

## 2016-11-18 DIAGNOSIS — Z79899 Other long term (current) drug therapy: Secondary | ICD-10-CM | POA: Insufficient documentation

## 2016-11-18 DIAGNOSIS — Z87891 Personal history of nicotine dependence: Secondary | ICD-10-CM | POA: Insufficient documentation

## 2016-11-18 LAB — COMPREHENSIVE METABOLIC PANEL
ALBUMIN: 3.3 g/dL — AB (ref 3.5–5.0)
ALT: 14 U/L (ref 14–54)
ANION GAP: 4 — AB (ref 5–15)
AST: 15 U/L (ref 15–41)
Alkaline Phosphatase: 46 U/L (ref 38–126)
BUN: 10 mg/dL (ref 6–20)
CHLORIDE: 113 mmol/L — AB (ref 101–111)
CO2: 22 mmol/L (ref 22–32)
Calcium: 8.3 mg/dL — ABNORMAL LOW (ref 8.9–10.3)
Creatinine, Ser: 0.75 mg/dL (ref 0.44–1.00)
GFR calc Af Amer: >60 mL/min (ref >60)
GFR calc non Af Amer: >60 mL/min (ref >60)
GLUCOSE: 88 mg/dL (ref 65–99)
POTASSIUM: 3.9 mmol/L (ref 3.5–5.1)
SODIUM: 139 mmol/L (ref 135–145)
TOTAL PROTEIN: 6.2 g/dL — AB (ref 6.5–8.1)
Total Bilirubin: 0.4 mg/dL (ref 0.3–1.2)

## 2016-11-18 LAB — CBC WITH DIFFERENTIAL/PLATELET
BASOS ABS: 0 10*3/uL (ref 0–0.1)
Basophils Relative: 0 %
EOS ABS: 0.3 10*3/uL (ref 0–0.7)
EOS PCT: 3 %
HCT: 33.6 % — ABNORMAL LOW (ref 35.0–47.0)
Hemoglobin: 11.3 g/dL — ABNORMAL LOW (ref 12.0–16.0)
Lymphocytes Relative: 27 %
Lymphs Abs: 3 10*3/uL (ref 1.0–3.6)
MCH: 29 pg (ref 26.0–34.0)
MCHC: 33.6 g/dL (ref 32.0–36.0)
MCV: 86.4 fL (ref 80.0–100.0)
MONO ABS: 0.9 10*3/uL (ref 0.2–0.9)
MONOS PCT: 8 %
Neutro Abs: 6.7 10*3/uL — ABNORMAL HIGH (ref 1.4–6.5)
Neutrophils Relative %: 62 %
PLATELETS: 288 10*3/uL (ref 150–440)
RBC: 3.89 MIL/uL (ref 3.80–5.20)
RDW: 13.8 % (ref 11.5–14.5)
WBC: 10.9 10*3/uL (ref 3.6–11.0)

## 2016-11-18 LAB — INFLUENZA PANEL BY PCR (TYPE A & B)
INFLAPCR: NEGATIVE
INFLBPCR: NEGATIVE

## 2016-11-18 LAB — TROPONIN I: Troponin I: <0.03 ng/mL (ref <0.03)

## 2016-11-18 MED ORDER — PROCHLORPERAZINE EDISYLATE 5 MG/ML IJ SOLN
10.0000 mg | Freq: Once | INTRAMUSCULAR | Status: AC
  Administered 2016-11-18: 10 mg via INTRAVENOUS
  Filled 2016-11-18: qty 2

## 2016-11-18 MED ORDER — FLUTICASONE PROPIONATE 50 MCG/ACT NA SUSP: 2.0000 | Freq: Every day | NASAL | 2 refills | Status: DC

## 2016-11-18 MED ORDER — SODIUM CHLORIDE 0.9 % IV BOLUS (SEPSIS)
1000.0000 mL | Freq: Once | INTRAVENOUS | Status: AC
  Administered 2016-11-18: 1000 mL via INTRAVENOUS

## 2016-11-18 MED ORDER — DIPHENHYDRAMINE HCL 50 MG/ML IJ SOLN
25.0000 mg | Freq: Once | INTRAMUSCULAR | Status: AC
  Administered 2016-11-18: 25 mg via INTRAVENOUS
  Filled 2016-11-18: qty 1

## 2016-11-18 MED ORDER — KETOROLAC TROMETHAMINE 30 MG/ML IJ SOLN
30.0000 mg | Freq: Once | INTRAMUSCULAR | Status: AC
  Administered 2016-11-18: 30 mg via INTRAVENOUS
  Filled 2016-11-18: qty 1

## 2016-11-18 NOTE — ED Notes (Signed)
Stafford,MD consulted. MD made aware of presenting complaints and triage assessment. MD with VORB for EKG and influenza at this time. Orders to be entered and carried by this RN.

## 2016-11-18 NOTE — ED Triage Notes (Signed)
Patient presents to the ED with multiple complaints. Patient with cough for over 2 weeks; reports that she has already been seen here for the same. Patient worsening symptoms over the last 3 days; (+) SOB, palpitations, pain in her neck and back, and a non-specific headache. NAD noted in triage.

## 2016-11-18 NOTE — ED Provider Notes (Signed)
[facility] Emergency Department Provider Note  ____________________________________________   First MD Initiated Contact with Patient 11/18/16 2036     (approximate)  I have reviewed the triage vital signs and the nursing notes.   HISTORY  Chief Complaint Chest wall pain; Cough; Shortness of Breath; Palpitations; and Headache   HPI Mary Terry is a 26 y.o. female with a history of mild cardiomegaly as well as major depressive disorder who is presenting to the emergency department 2 days of worsening frontal headache. Headache is frontal and attentive 10 at this time. She reports as well that she is having sinus congestion. She denies any chest pain although she did report this earlier in triage. She says that she has felt palpitations. Denies any cough or shortness of breath. Has had sick contacts with her children. Says that she has had on and off nasal congestion over the past several weeks. It is been taking decongestants without any relief. She also had a visit on January 8 for constipation which was deemed to be secondary to medications. She says the headache started about mildly 2 days ago and has progressed. She also reports neck and shoulder pain. Reports sensitivity to light. Despite her bridge source of breath and tries she again denies this to me. She says that her primary reason for coming today with a frontal headache. She denies any ear pressure or sore throat.   Past Medical History:  Diagnosis Date  . Herpes genitalis   . History of substance abuse   . Hx of preeclampsia, prior pregnancy, currently pregnant     Patient Active Problem List   Diagnosis Date Noted  . Tobacco use disorder 03/10/2016  . Cannabis use disorder, mild, abuse 03/10/2016  . Suicidal ideation 03/09/2016  . Alcohol use disorder, mild, abuse 03/09/2016  . Major depressive disorder, recurrent, 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  . Mild cardiomegaly  07/13/2015  . Renal insufficiency 07/13/2015    Past Surgical History:  Procedure Laterality Date  . NO PAST SURGERIES      Prior to Admission medications   Medication Sig Start Date End Date Taking? Authorizing Provider  fluvoxaMINE (LUVOX) 100 MG tablet Take 1 tablet (100 mg total) by mouth at bedtime. 03/12/16   Shari ProwsJolanta B Pucilowska, MD  hydrocortisone (ANUSOL-HC) 2.5 % rectal cream Apply rectally 2 times daily 11/13/16   Tammy Triplett, PA-C  meloxicam (MOBIC) 15 MG tablet Take 1 tablet (15 mg total) by mouth daily. 09/12/16   Evon Slackhomas C Gaines, PA-C  methocarbamol (ROBAXIN) 750 MG tablet Take 1 tablet (750 mg total) by mouth 3 (three) times daily. 03/12/16   Shari ProwsJolanta B Pucilowska, MD  methylPREDNISolone (MEDROL DOSEPAK) 4 MG TBPK tablet Take as directed 11/02/16   Irean HongJade J Sung, MD  QUEtiapine (SEROQUEL) 200 MG tablet Take 1 tablet (200 mg total) by mouth at bedtime. 03/12/16   Shari ProwsJolanta B Pucilowska, MD  QUEtiapine (SEROQUEL) 25 MG tablet Take 1 tablet (25 mg total) by mouth 3 (three) times daily. 03/12/16   Jolanta B Pucilowska, MD  temazepam (RESTORIL) 15 MG capsule Take 1 capsule (15 mg total) by mouth at bedtime. 03/12/16   Shari ProwsJolanta B Pucilowska, MD  traMADol (ULTRAM) 50 MG tablet Take 1 tablet (50 mg total) by mouth every 6 (six) hours as needed (pain). 03/12/16   Shari ProwsJolanta B Pucilowska, MD    Allergies Percocet [oxycodone-acetaminophen]  Family History  Problem Relation Age of Onset  . Family history unknown: Yes    Social  History Social History  Substance Use Topics  . Smoking status: Former Smoker    Packs/day: 0.50    Years: 3.00    Types: Cigarettes    Quit date: 11/06/2016  . Smokeless tobacco: Never Used  . Alcohol use No    Review of Systems Constitutional: No fever/chills Eyes: No visual changes. ENT: No sore throat. Cardiovascular: as above Respiratory: Denies shortness of breath. Gastrointestinal: No abdominal pain.  No nausea, no vomiting.  No diarrhea.  No  constipation. Genitourinary: Negative for dysuria. Musculoskeletal: Negative for back pain. Skin: Negative for rash. Neurological: Negative for focal weakness or numbness.  10-point ROS otherwise negative.  ____________________________________________   PHYSICAL EXAM:  VITAL SIGNS: ED Triage Vitals  Enc Vitals Group     BP 11/18/16 1944 (!) 120/91     Pulse Rate 11/18/16 1944 85     Resp 11/18/16 1944 18     Temp 11/18/16 1944 98.4 F (36.9 C)     Temp Source 11/18/16 1944 Oral     SpO2 11/18/16 1944 100 %     Weight 11/18/16 1942 196 lb (88.9 kg)     Height 11/18/16 1942 5\' 4"  (1.626 m)     Head Circumference --      Peak Flow --      Pain Score 11/18/16 1942 10     Pain Loc --      Pain Edu? --      Excl. in GC? --     Constitutional: Alert and oriented. Well appearing and in no acute distress. Eyes: Conjunctivae are normal. PERRL. EOMI. Head: Atraumatic.Tenderness palpation over the frontal sinus. Nose: Mild bilateral congestion. Mouth/Throat: Mucous membranes are moist.  Neck: No stridor.  No meningismus.  No ttp.   Cardiovascular: Normal rate, regular rhythm. Grossly normal heart sounds.  Respiratory: Normal respiratory effort.  No retractions. Lungs CTAB. Gastrointestinal: Soft and nontender. No distention.  Musculoskeletal: No lower extremity tenderness nor edema.  No joint effusions. Neurologic:  Normal speech and language. No gross focal neurologic deficits are appreciated.  Skin:  Skin is warm, dry and intact. No rash noted. Psychiatric: Mood and affect are normal. Speech and behavior are normal.  ____________________________________________   LABS (all labs ordered are listed, but only abnormal results are displayed)  Labs Reviewed  CBC WITH DIFFERENTIAL/PLATELET - Abnormal; Notable for the following:       Result Value   Hemoglobin 11.3 (*)    HCT 33.6 (*)    Neutro Abs 6.7 (*)    All other components within normal limits  COMPREHENSIVE METABOLIC  PANEL - Abnormal; Notable for the following:    Chloride 113 (*)    Calcium 8.3 (*)    Total Protein 6.2 (*)    Albumin 3.3 (*)    Anion gap 4 (*)    All other components within normal limits  INFLUENZA PANEL BY PCR (TYPE A & B, H1N1)  TROPONIN I   ____________________________________________  EKG  ED ECG REPORT I, Arelia Longest, the attending physician, personally viewed and interpreted this ECG.   Date: 11/18/2016  EKG Time: 1944  Rate: 72  Rhythm: normal sinus  Axis: normal  Intervals:none  ST&T Change: No ST segment elevation or depression. No abnormal T-wave inversion.  ____________________________________________  RADIOLOGY  Patient with recent chest x-ray on January 8 without any acute findings. ____________________________________________   PROCEDURES  Procedure(s) performed:   Procedures  Critical Care performed:   ____________________________________________   INITIAL IMPRESSION / ASSESSMENT  AND PLAN / ED COURSE  Pertinent labs & imaging results that were available during my care of the patient were reviewed by me and considered in my medical decision making (see chart for details).   Clinical Course    ----------------------------------------- 11:29 PM on 11/18/2016 -----------------------------------------  Patient with relief of symptoms after medications. Plan is to discharge to home. Likely sinus headache with tension component. We'll discharge with Flonase. Improved labs from last evaluation. I reviewed the labs as well as the diagnosis with the patient as well as her mother. She is understanding of the plan and willing to comply. Will be following up at the [facility] clinic.  ____________________________________________   FINAL CLINICAL IMPRESSION(S) / ED DIAGNOSES  Headache. Chest pain.    NEW MEDICATIONS STARTED DURING THIS VISIT:  New Prescriptions   No medications on file     Note:  This document was prepared using  Dragon voice recognition software and may include unintentional dictation errors.    Myrna Blazer, MD 11/18/16 [phone]

## 2017-01-25 DIAGNOSIS — Z0184 Encounter for antibody response examination: Secondary | ICD-10-CM | POA: Diagnosis not present

## 2017-01-28 DIAGNOSIS — R1011 Right upper quadrant pain: Secondary | ICD-10-CM | POA: Diagnosis present

## 2017-01-28 DIAGNOSIS — R1030 Lower abdominal pain, unspecified: Secondary | ICD-10-CM | POA: Insufficient documentation

## 2017-01-28 DIAGNOSIS — Z87891 Personal history of nicotine dependence: Secondary | ICD-10-CM | POA: Insufficient documentation

## 2017-01-29 ENCOUNTER — Emergency Department (HOSPITAL_COMMUNITY): Payer: Medicaid Other

## 2017-01-29 ENCOUNTER — Emergency Department (HOSPITAL_COMMUNITY)
Admission: EM | Admit: 2017-01-29 | Discharge: 2017-01-29 | Disposition: A | Discharge status: Home / Self Care | Payer: Medicaid Other | Attending: Emergency Medicine | Admitting: Emergency Medicine

## 2017-01-29 ENCOUNTER — Encounter (HOSPITAL_COMMUNITY): Payer: Self-pay

## 2017-01-29 DIAGNOSIS — R1030 Lower abdominal pain, unspecified: Secondary | ICD-10-CM

## 2017-01-29 LAB — COMPREHENSIVE METABOLIC PANEL
ALK PHOS: 69 U/L (ref 38–126)
ALT: 16 U/L (ref 14–54)
ANION GAP: 6 (ref 5–15)
AST: 16 U/L (ref 15–41)
Albumin: 3.9 g/dL (ref 3.5–5.0)
BUN: 13 mg/dL (ref 6–20)
CALCIUM: 9.2 mg/dL (ref 8.9–10.3)
CO2: 27 mmol/L (ref 22–32)
Chloride: 105 mmol/L (ref 101–111)
Creatinine, Ser: 1 mg/dL (ref 0.44–1.00)
GFR calc non Af Amer: >60 mL/min (ref >60)
Glucose, Bld: 87 mg/dL (ref 65–99)
Potassium: 4.1 mmol/L (ref 3.5–5.1)
SODIUM: 138 mmol/L (ref 135–145)
Total Bilirubin: 0.2 mg/dL — ABNORMAL LOW (ref 0.3–1.2)
Total Protein: 7.6 g/dL (ref 6.5–8.1)

## 2017-01-29 LAB — POC URINE PREG, ED: Preg Test, Ur: NEGATIVE

## 2017-01-29 LAB — URINALYSIS, ROUTINE W REFLEX MICROSCOPIC
BILIRUBIN URINE: NEGATIVE
Bacteria, UA: NONE SEEN
GLUCOSE, UA: NEGATIVE mg/dL
HGB URINE DIPSTICK: NEGATIVE
KETONES UR: NEGATIVE mg/dL
NITRITE: NEGATIVE
PROTEIN: NEGATIVE mg/dL
Specific Gravity, Urine: 1.014 (ref 1.005–1.030)
pH: 5 (ref 5.0–8.0)

## 2017-01-29 LAB — CBC
HCT: 38 % (ref 36.0–46.0)
HEMOGLOBIN: 12.6 g/dL (ref 12.0–15.0)
MCH: 28.8 pg (ref 26.0–34.0)
MCHC: 33.2 g/dL (ref 30.0–36.0)
MCV: 86.8 fL (ref 78.0–100.0)
Platelets: 362 10*3/uL (ref 150–400)
RBC: 4.38 MIL/uL (ref 3.87–5.11)
RDW: 13.3 % (ref 11.5–15.5)
WBC: 12.1 10*3/uL — AB (ref 4.0–10.5)

## 2017-01-29 LAB — WET PREP, GENITAL
SPERM: NONE SEEN
YEAST WET PREP: NONE SEEN

## 2017-01-29 LAB — LIPASE, BLOOD: LIPASE: 23 U/L (ref 11–51)

## 2017-01-29 MED ORDER — METRONIDAZOLE 500 MG PO TABS: 500.0000 mg | ORAL_TABLET | Freq: Two times a day (BID) | ORAL | 0 refills | Status: DC

## 2017-01-29 MED ORDER — IOPAMIDOL (ISOVUE-300) INJECTION 61%
INTRAVENOUS | Status: AC
  Administered 2017-01-29: 100 mL via INTRAVENOUS
  Filled 2017-01-29: qty 30

## 2017-01-29 MED ORDER — IOPAMIDOL (ISOVUE-300) INJECTION 61%
INTRAVENOUS | Status: AC
  Administered 2017-01-29: 30 mL via ORAL
  Filled 2017-01-29: qty 100

## 2017-01-29 MED ORDER — SODIUM CHLORIDE 0.9 % IV BOLUS (SEPSIS)
1000.0000 mL | Freq: Once | INTRAVENOUS | Status: AC
  Administered 2017-01-29: 1000 mL via INTRAVENOUS

## 2017-01-29 MED ORDER — DOXYCYCLINE HYCLATE 100 MG PO CAPS: 100.0000 mg | ORAL_CAPSULE | Freq: Two times a day (BID) | ORAL | 0 refills | Status: DC

## 2017-01-29 MED ORDER — KETOROLAC TROMETHAMINE 30 MG/ML IJ SOLN
30.0000 mg | Freq: Once | INTRAMUSCULAR | Status: AC
  Administered 2017-01-29: 30 mg via INTRAVENOUS
  Filled 2017-01-29: qty 1

## 2017-01-29 MED ORDER — MORPHINE SULFATE (PF) 4 MG/ML IV SOLN: 6.0000 mg | Freq: Once | INTRAVENOUS | Status: DC

## 2017-01-29 MED ORDER — MORPHINE SULFATE (PF) 4 MG/ML IV SOLN
4.0000 mg | INTRAVENOUS | Status: DC | PRN
  Administered 2017-01-29 (×2): 4 mg via INTRAVENOUS
  Filled 2017-01-29 (×2): qty 1

## 2017-01-29 MED ORDER — MORPHINE SULFATE (PF) 4 MG/ML IV SOLN: 4.0000 mg | Freq: Once | INTRAVENOUS | Status: DC

## 2017-01-29 NOTE — ED Notes (Signed)
Pt alert & oriented x4, stable gait. Patient given discharge instructions, paperwork & prescription(s). Patient  instructed to stop at the registration desk to finish any additional paperwork. Patient verbalized understanding. Pt left department w/ no further questions. 

## 2017-01-29 NOTE — ED Provider Notes (Signed)
[facility]-EMERGENCY DEPT Provider Note   CSN: [id number] Arrival date & time: 01/28/17  2342 By signing my name below, I, Bridgette HabermannMaria Tan, attest that this documentation has been prepared under the direction and in the presence of Azalia BilisKevin Shawne Bulow, MD. Electronically Signed: Bridgette HabermannMaria Tan, ED Scribe. 01/29/17. 12:53 AM.  History   Chief Complaint Chief Complaint  Patient presents with  . Abdominal Pain    HPI The history is provided by the patient. No language interpreter was used.   HPI Comments: Mary Terry is a 26 y.o. female with h/o substance abuse, who presents to the Emergency Department complaining of RUQ abdominal pain with distension beginning one week ago. Pt also has associated urinary frequency. Pt states pain is cramping and pressure-like in quality. She states the pain feels as if she has to have a bowel movement so she has been taking laxatives very mild relief to her symptoms. Pain is also worsened with movement, coughing, and deep breathing. LMP 01/15/17. Pt denies fever, chills, nausea, vomiting, hematochezia, diarrhea, dysuria, hematuria, or any other associated symptoms.   Past Medical History:  Diagnosis Date  . Herpes genitalis   . History of substance abuse   . Hx of preeclampsia, prior pregnancy,     Patient Active Problem List   Diagnosis Date Noted  . Tobacco use disorder 03/10/2016  . Cannabis use disorder, mild, abuse 03/10/2016  . Suicidal ideation 03/09/2016  . Alcohol use disorder, mild, abuse 03/09/2016  . Major depressive disorder, recurrent, 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  . Mild cardiomegaly 07/13/2015  . Renal insufficiency 07/13/2015    Past Surgical History:  Procedure Laterality Date  . NO PAST SURGERIES      OB History    Gravida Para Term Preterm AB Living   3 3 3  0 0 3   SAB TAB Ectopic Multiple Live Births   0 0 0 0 3       Home Medications    Prior to Admission medications   Medication Sig Start Date End Date Taking?  Authorizing Provider  fluvoxaMINE (LUVOX) 100 MG tablet Take 1 tablet (100 mg total) by mouth at bedtime. 03/12/16   Shari ProwsJolanta B Pucilowska, MD  QUEtiapine (SEROQUEL) 200 MG tablet Take 1 tablet (200 mg total) by mouth at bedtime. 03/12/16   Shari ProwsJolanta B Pucilowska, MD  QUEtiapine (SEROQUEL) 25 MG tablet Take 1 tablet (25 mg total) by mouth 3 (three) times daily. 03/12/16   Jolanta B Pucilowska, MD  temazepam (RESTORIL) 15 MG capsule Take 1 capsule (15 mg total) by mouth at bedtime. 03/12/16   Shari ProwsJolanta B Pucilowska, MD    Family History Family History  Problem Relation Age of Onset  . Family history unknown: Yes    Social History Social History  Substance Use Topics  . Smoking status: Former Smoker    Packs/day: 0.50    Years: 3.00    Types: Cigarettes    Quit date: 11/06/2016  . Smokeless tobacco: Never Used  . Alcohol use No     Allergies   Percocet [oxycodone-acetaminophen]   Review of Systems Review of Systems 10 Systems reviewed and all are negative for acute change except as noted in the HPI. Physical Exam Updated Vital Signs BP (!) 110/54 (BP Location: Right Arm)   Pulse 76   Temp 98.3 F (36.8 C) (Oral)   Resp 17   Ht 5\' 4"  (1.626 m)   Wt 196 lb (88.9 kg)   LMP 01/15/2017 (Exact Date)   SpO2 98%  BMI 33.64 kg/m   Physical Exam  Constitutional: She is oriented to person, place, and time. She appears well-developed and well-nourished. No distress.  HENT:  Head: Normocephalic and atraumatic.  Eyes: EOM are normal.  Neck: Normal range of motion.  Cardiovascular: Normal rate, regular rhythm and normal heart sounds.   Pulmonary/Chest: Effort normal and breath sounds normal.  Abdominal: Soft. She exhibits no distension. There is tenderness.  Mild suprapubic tenderness.  Genitourinary:  Genitourinary Comments: Chaperone present.  Normal external genitalia.  Cervix normal.  No cervical motion tenderness.  No vaginal discharge noted.  No adnexal masses or fullness    Musculoskeletal: Normal range of motion.  Neurological: She is alert and oriented to person, place, and time.  Skin: Skin is warm and dry.  Psychiatric: She has a normal mood and affect. Judgment normal.  Nursing note and vitals reviewed.    ED Treatments / Results  DIAGNOSTIC STUDIES: Oxygen Saturation is 100% on RA, normal by my interpretation.   COORDINATION OF CARE: 12:53 AM-Discussed next steps with pt. Pt verbalized understanding and is agreeable with the plan.   Labs (all labs ordered are listed, but only abnormal results are displayed) Labs Reviewed  WET PREP, GENITAL - Abnormal; Notable for the following:       Result Value   Trich, Wet Prep MANY (*)    WBC, Wet Prep HPF POC FEW (*)    All other components within normal limits  COMPREHENSIVE METABOLIC PANEL - Abnormal; Notable for the following:    Total Bilirubin 0.2 (*)    All other components within normal limits  CBC - Abnormal; Notable for the following:    WBC 12.1 (*)    All other components within normal limits  URINALYSIS, ROUTINE W REFLEX MICROSCOPIC - Abnormal; Notable for the following:    APPearance HAZY (*)    Leukocytes, UA SMALL (*)    Squamous Epithelial / LPF 6-30 (*)    All other components within normal limits  URINE CULTURE  LIPASE, BLOOD  POC URINE PREG, ED  GC/CHLAMYDIA PROBE AMP ([place]) NOT AT [facility]    EKG  EKG Interpretation None       Radiology Ct Abdomen Pelvis W Contrast  Result Date: 01/29/2017 CLINICAL DATA:  Lower abdominal pain. EXAM: CT ABDOMEN AND PELVIS WITH CONTRAST TECHNIQUE: Multidetector CT imaging of the abdomen and pelvis was performed using the standard protocol following bolus administration of intravenous contrast. CONTRAST:  100 cc Isovue-300 IV COMPARISON:  Radiographs 11/13/2016, CT 04/06/2014 FINDINGS: Lower chest: Mild hypoventilatory atelectasis at the lung bases. No consolidation or pleural fluid. Hepatobiliary: No focal liver abnormality is seen.  No gallstones, gallbladder wall thickening, or biliary dilatation. Pancreas: No ductal dilatation or inflammation. Spleen: Normal in size without focal abnormality. Adrenals/Urinary Tract: Adrenal glands are unremarkable. Kidneys are normal, without renal calculi, focal lesion, or hydronephrosis. Bladder is unremarkable. Stomach/Bowel: The stomach is physiologically distended. No evidence of small bowel wall thickening, distention or inflammation. Colon is fluid-filled without abnormal distension or wall thickening. No pericolonic inflammation. Normal appendix. Vascular/Lymphatic: A few prominent central mesenteric nodes. No significant pelvic adenopathy. Prominent left ovarian vein measuring 7 mm. Reproductive: Induration of the periuterine and pelvic fat suggesting pelvic inflammatory disease. The ovaries are symmetric in size. No evidence of pelvic fluid collection. Other: Trace free fluid in the pelvis. No upper abdominal ascites. No free air. Small fat containing umbilical hernia. Musculoskeletal: There are no acute or suspicious osseous abnormalities. IMPRESSION: Induration of the pelvic fat  is suspicious for pelvic inflammatory disease. No well-defined fluid collection or abscess. Electronically Signed   By: Rubye Oaks M.D.   On: 01/29/2017 02:45    Procedures Procedures (including critical care time)  Medications Ordered in ED Medications  morphine 4 MG/ML injection 4 mg (4 mg Intravenous Given 01/29/17 0249)  sodium chloride 0.9 % bolus 1,000 mL (0 mLs Intravenous Stopped 01/29/17 0212)  iopamidol (ISOVUE-300) 61 % injection (30 mLs Oral Contrast Given 01/29/17 0215)  iopamidol (ISOVUE-300) 61 % injection (100 mLs Intravenous Contrast Given 01/29/17 0215)  ketorolac (TORADOL) 30 MG/ML injection 30 mg (30 mg Intravenous Given 01/29/17 0344)     Initial Impression / Assessment and Plan / ED Course  I have reviewed the triage vital signs and the nursing notes.  Pertinent labs & imaging  results that were available during my care of the patient were reviewed by me and considered in my medical decision making (see chart for details).     CT scan concerning for pelvic inflammatory disease however on pelvic exam she is not very tender.  She has no cervical motion tenderness or adnexal masses or fullness.  Her wet prep take him back with trichomonas.  Because of this she'll be prescribed Flagyl.  I think it's reasonable to trial her on a course of doxycycline as well for treatment for PID even though there is no clear-cut etiology on examination.  Outpatient OB/GYN follow-up.  Initially she was discharged home prior to return of her wet prep.  I personally called the patient and told her of the findings with trichomonas.  I will call in prescriptions for metronidazole and doxycycline to the [facility] pharmacy in [place]   Final Clinical Impressions(s) / ED Diagnoses   Final diagnoses:  Lower abdominal pain    New Prescriptions Discharge Medication List as of 01/29/2017  3:47 AM     I personally performed the services described in this documentation, which was scribed in my presence. The recorded information has been reviewed and is accurate.        Azalia Bilis, MD 01/29/17 [phone]

## 2017-01-29 NOTE — ED Triage Notes (Signed)
Pt reports pain in RUQ about 1 week ago with bloating and cramping. Pt says she thought was constipated because she had small hard stool, so she has been taking MOM and stool softners that have helped her have BM that is now soft formed stool, but the pain has not improved.

## 2017-01-29 NOTE — ED Notes (Signed)
Pelvic cart to bedside 

## 2017-01-29 NOTE — ED Notes (Addendum)
Pt called and reported was discharged home and reports EDP contacted pt and stated was diagnosed with "trich" and "would call in prescription for doxycycline and flagyl". Consulted [facility] [place] and called in prescriptions (500mg  flagyl PO 1 pill BID x10 days & doxycycline 100mg  capsule PO 1 pill BID x10 days). Pt educated on abx administration and importance of back up birth control methods. Pt verbalized understanding and reported would pick up prescriptions.   Prescriptions verified via readback with pharmacist.

## 2017-01-29 NOTE — ED Provider Notes (Signed)
Attempted to E-Prescribe, but this failed  [facility] [phone]  Metronidazole 500 mg One tab by mouth twice a day 7 days  Doxycycline 100 mg One tab by mouth twice a day 7 days   Azalia BilisKevin Gemini Bunte, MD 01/29/17 [phone]

## 2017-01-30 LAB — URINE CULTURE

## 2017-01-30 LAB — GC/CHLAMYDIA PROBE AMP (~~LOC~~) NOT AT ARMC
CHLAMYDIA, DNA PROBE: POSITIVE — AB
Neisseria Gonorrhea: NEGATIVE

## 2017-04-06 ENCOUNTER — Encounter: Payer: Self-pay | Admitting: Women's Health

## 2017-04-06 ENCOUNTER — Ambulatory Visit (INDEPENDENT_AMBULATORY_CARE_PROVIDER_SITE_OTHER): Payer: Medicaid Other | Admitting: Women's Health

## 2017-04-06 VITALS — BP 120/58 | HR 80 | Ht 64.0 in | Wt 217.0 lb

## 2017-04-06 DIAGNOSIS — Z3201 Encounter for pregnancy test, result positive: Secondary | ICD-10-CM

## 2017-04-06 DIAGNOSIS — Z8759 Personal history of other complications of pregnancy, childbirth and the puerperium: Secondary | ICD-10-CM | POA: Insufficient documentation

## 2017-04-06 DIAGNOSIS — Z3490 Encounter for supervision of normal pregnancy, unspecified, unspecified trimester: Secondary | ICD-10-CM

## 2017-04-06 DIAGNOSIS — O09299 Supervision of pregnancy with other poor reproductive or obstetric history, unspecified trimester: Secondary | ICD-10-CM

## 2017-04-06 DIAGNOSIS — Z3491 Encounter for supervision of normal pregnancy, unspecified, first trimester: Secondary | ICD-10-CM

## 2017-04-06 LAB — POCT URINE PREGNANCY: PREG TEST UR: POSITIVE — AB

## 2017-04-06 NOTE — Progress Notes (Signed)
   [facility] Visit  Patient name: Macario Goldslicia N Servello MRN [id number]  Date of birth: 10/29/1991  CC & HPI:  Macario Goldslicia N Darrah is a 26 y.o. [id number] African American female at 9221w1d by certain LMP presenting today for +HPT. Some mild nausea, denies vomiting. No pain, vb, etc. Is taking pnv. Denies any other meds, or illicit drugs. Does have h/o pp pre-e x 2.  Patient's last menstrual period was 02/15/2017.  Pertinent History Reviewed:  Medical & Surgical Hx:   Past medical, surgical, family, and social history reviewed in electronic medical record Medications: Reviewed & Updated - see associated section Allergies: Reviewed in electronic medical record  Objective Findings:  Vitals: BP (!) 120/58 (BP Location: Right Arm, Patient Position: Sitting, Cuff Size: Large)   Pulse 80   Ht 5\' 4"  (1.626 m)   Wt 217 lb (98.4 kg)   LMP 02/15/2017   BMI 37.25 kg/m  Body mass index is 37.25 kg/m.  Physical Examination: General appearance - alert, well appearing, and in no distress  Results for orders placed or performed in visit on 04/06/17 (from the past 24 hour(s))  POCT urine pregnancy   Collection Time: 04/06/17 11:09 AM  Result Value Ref Range   Preg Test, Ur Positive (A) Negative     Assessment & Plan:  A:   5021w1d pregnant by certain LMP  H/O pp pre-e x 2  P:  Discussed and gave printed info on n/v tips/tricks  Discussed warning s/s to report  Continue pnv  Plan baby asa @ 12wks  Return in about 1 week (around 04/13/2017) for dating u/s.  Marge DuncansBooker, Ademide Schaberg Randall CNM, [facility]NP-BC 04/06/2017 11:20 AM

## 2017-04-06 NOTE — Patient Instructions (Signed)

## 2017-04-11 ENCOUNTER — Ambulatory Visit (INDEPENDENT_AMBULATORY_CARE_PROVIDER_SITE_OTHER): Payer: Medicaid Other

## 2017-04-11 DIAGNOSIS — O200 Threatened abortion: Secondary | ICD-10-CM

## 2017-04-11 DIAGNOSIS — Z3491 Encounter for supervision of normal pregnancy, unspecified, first trimester: Secondary | ICD-10-CM

## 2017-04-11 NOTE — Progress Notes (Unsigned)
Large SCH in pregnancy s<<Dates, with serial hcg being collected.

## 2017-04-11 NOTE — Progress Notes (Signed)
Dating/Viability US today. GS visualized without YS or FP. GS 0.97cm (7881w5d) which does not correlate with LMP dating. Large SCH visualized 1.9x1.8x1.3cm. Bilateral ovaries WNL. No free fluid visualized.

## 2017-04-12 ENCOUNTER — Telehealth: Payer: Self-pay | Admitting: Obstetrics & Gynecology

## 2017-04-12 DIAGNOSIS — O200 Threatened abortion: Secondary | ICD-10-CM

## 2017-04-12 NOTE — Telephone Encounter (Signed)
Patient called earlier stating that she did not understand why the ultrasound did not show YS or FP. Explained to patient that she could just be too early but Hcg and repeat would give us more information. Verbalized understanding.   Called patient to inform of Hcg level of 5362 and need for repeat per Dr Emelda FearFerguson to assess level if going up or down. Patient states she will come [facility] in am for lab draw.

## 2017-04-13 ENCOUNTER — Telehealth: Payer: Self-pay | Admitting: *Deleted

## 2017-04-13 ENCOUNTER — Other Ambulatory Visit: Payer: Medicaid Other

## 2017-04-13 DIAGNOSIS — O200 Threatened abortion: Secondary | ICD-10-CM

## 2017-04-13 LAB — BETA HCG QUANT (REF LAB): HCG QUANT: 6207 m[IU]/mL

## 2017-04-13 NOTE — Telephone Encounter (Signed)
Informed patient that Hcg went up but per Dr Despina HiddenEure needs a repeat Hcg Monday or another dating ultrasound the week of June 18. Stated she did not want her blood drawn again so wanted to just do the ultrasound. Will schedule.

## 2017-04-16 ENCOUNTER — Telehealth: Payer: Self-pay | Admitting: Obstetrics and Gynecology

## 2017-04-16 NOTE — Telephone Encounter (Signed)
Patient called, and message left for the patient to expect to be called to schedule followup u/s in a week., Next Monday.

## 2017-04-26 ENCOUNTER — Other Ambulatory Visit: Payer: Self-pay | Admitting: Obstetrics and Gynecology

## 2017-04-26 DIAGNOSIS — O3680X0 Pregnancy with inconclusive fetal viability, not applicable or unspecified: Secondary | ICD-10-CM

## 2017-04-27 ENCOUNTER — Ambulatory Visit (INDEPENDENT_AMBULATORY_CARE_PROVIDER_SITE_OTHER): Payer: Medicaid Other

## 2017-04-27 ENCOUNTER — Other Ambulatory Visit: Payer: Self-pay | Admitting: Obstetrics and Gynecology

## 2017-04-27 ENCOUNTER — Ambulatory Visit (INDEPENDENT_AMBULATORY_CARE_PROVIDER_SITE_OTHER): Payer: Medicaid Other | Admitting: Obstetrics & Gynecology

## 2017-04-27 ENCOUNTER — Telehealth: Payer: Self-pay

## 2017-04-27 ENCOUNTER — Encounter: Payer: Self-pay | Admitting: Obstetrics & Gynecology

## 2017-04-27 VITALS — BP 130/86 | HR 84 | Wt 215.0 lb

## 2017-04-27 DIAGNOSIS — O3680X0 Pregnancy with inconclusive fetal viability, not applicable or unspecified: Secondary | ICD-10-CM

## 2017-04-27 DIAGNOSIS — O021 Missed abortion: Secondary | ICD-10-CM

## 2017-04-27 NOTE — Progress Notes (Signed)
Follow up appointment for results  Chief Complaint  Patient presents with  . Pregnancy Ultrasound    Blood pressure 130/86, pulse 84, weight 215 lb (97.5 kg), last menstrual period 02/15/2017, unknown if currently breastfeeding.  No results found.    MEDS ordered this encounter: No orders of the defined types were placed in this encounter.   Orders for this encounter: No orders of the defined types were placed in this encounter.   Impression: Missed ab    Plan: I discussed the results with patient in full and she is aware that this is a nonviable pregnancy She will stay the weekend disorder thing things through she is given the option of complete conservative management versus using Cytotec versus repeating sonogram in 2 weeks depending on her level of comfort with the overall situation She will lock contact the office, and we let me know which direction she wants to go but she is aware definitively that this is a nonviable pregnancy  Follow Up: Return in about 2 weeks (around 05/11/2017) for sonogram, , Follow up, with Dr Despina HiddenEure.     All questions were answered.  Past Medical History:  Diagnosis Date  . Herpes genitalis   . History of substance abuse   . Hx of preeclampsia, prior pregnancy,     Past Surgical History:  Procedure Laterality Date  . NO PAST SURGERIES      OB History    Gravida Para Term Preterm AB Living   4 3 3  0 0 3   SAB TAB Ectopic Multiple Live Births   0 0 0 0 3      Allergies  Allergen Reactions  . Percocet [Oxycodone-Acetaminophen] Itching    Social History   Social History  . Marital status: Divorced    Spouse name: N/A  . Number of children: N/A  . Years of education: N/A   Social History Main Topics  . Smoking status: Former Smoker    Packs/day: 0.50    Years: 3.00    Types: Cigarettes    Quit date: 11/06/2016  . Smokeless tobacco: Never Used  . Alcohol use No  . Drug use: No  . Sexual activity: Yes     Comment: no  contreceptive or condom use    Other Topics Concern  . None   Social History Narrative  . None    Family History  Problem Relation Age of Onset  . Hypertension Paternal Grandmother   . Diabetes Maternal Grandfather

## 2017-04-27 NOTE — Progress Notes (Addendum)
US 6+2 wks single IUP w/ys,NO FHT,GS 1.36 cm,crl 5.89 mm,normal ovaries bilat,subchorionic hemorrhage on both sides of gestational sac (#1) 3.8 x 1.5 x 2.4 cm,(#2) 1.9 x 2.5 x .7 cm,results discussed w/Dr Despina HiddenEure

## 2017-04-27 NOTE — Telephone Encounter (Signed)
LMTRC

## 2017-04-27 NOTE — Telephone Encounter (Signed)
Pt states she was/is a pt here & has some questions. Cb#[phone].

## 2017-05-08 ENCOUNTER — Telehealth: Payer: Self-pay | Admitting: Obstetrics & Gynecology

## 2017-05-08 NOTE — Telephone Encounter (Signed)
[facility] Patient is transferring [facility]. I called and left voicemail for patient to call back to schedule NOB appt.

## 2017-05-13 ENCOUNTER — Encounter (HOSPITAL_COMMUNITY): Payer: Self-pay | Admitting: *Deleted

## 2017-05-13 ENCOUNTER — Emergency Department (HOSPITAL_COMMUNITY)
Admission: EM | Admit: 2017-05-13 | Discharge: 2017-05-13 | Disposition: A | Discharge status: Home / Self Care | Payer: Medicaid Other | Attending: Emergency Medicine | Admitting: Emergency Medicine

## 2017-05-13 DIAGNOSIS — O26851 Spotting complicating pregnancy, first trimester: Secondary | ICD-10-CM | POA: Diagnosis present

## 2017-05-13 DIAGNOSIS — O039 Complete or unspecified spontaneous abortion without complication: Secondary | ICD-10-CM | POA: Insufficient documentation

## 2017-05-13 DIAGNOSIS — Z3A12 12 weeks gestation of pregnancy: Secondary | ICD-10-CM | POA: Insufficient documentation

## 2017-05-13 DIAGNOSIS — Z87891 Personal history of nicotine dependence: Secondary | ICD-10-CM | POA: Diagnosis not present

## 2017-05-13 DIAGNOSIS — N939 Abnormal uterine and vaginal bleeding, unspecified: Secondary | ICD-10-CM

## 2017-05-13 NOTE — ED Triage Notes (Signed)
Pt comes in with vaginal bleeding starting 2 days ago. She states she is having lower abdominal cramping. LMP was 02/15/17.  This is her fourth pregnancy, no prior miscarriages.

## 2017-05-13 NOTE — Discharge Instructions (Signed)
Please call [facility] tomorrow to arrange close follow-up for a D&C.   You can take ibuprofen 600-800 mg every 6 hours as needed for cramping.  Please return without fail for worsening symptoms, including passing out, intractable vomiting, fever, bleeding > 1 pad per hour, severe pains, or any other symptoms concerning to you.

## 2017-05-13 NOTE — ED Provider Notes (Signed)
[facility] DEPT Provider Note   CSN: [id number] Arrival date & time: 05/13/17  1216     History   Chief Complaint Chief Complaint  Patient presents with  . Threatened Miscarriage    HPI Mary Terry is a 26 y.o. female.  HPI 26 year old female who presents with vaginal bleeding. G4P3 at reported [redacted] weeks GA. Had vaginal spotting last night, and passage of blood clot and more substantial bleeding this morning. No significant cramping, urinary complaints, fever, vomiting.   Patient seen by Dr. Despina Hidden in 04/2017 with ultrasound showing failure of progression of pregnancy. Concerning for non-viable IUP or missed abortion. Plan was for expectant management versus cytotec. Patient reports that she did not want to believe it at first, and wanted to get a second opinion.   Past Medical History:  Diagnosis Date  . Herpes genitalis   . History of substance abuse   . Hx of preeclampsia, prior pregnancy,     Patient Active Problem List   Diagnosis Date Noted  . Pregnant 04/06/2017  . Hx of preeclampsia, prior pregnancy, currently pregnant 04/06/2017  . Tobacco use disorder 03/10/2016  . Cannabis use disorder, mild, abuse 03/10/2016  . Suicidal ideation 03/09/2016  . Alcohol use disorder, mild, abuse 03/09/2016  . Major depressive disorder, recurrent, 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  . Mild cardiomegaly 07/13/2015  . Renal insufficiency 07/13/2015    Past Surgical History:  Procedure Laterality Date  . NO PAST SURGERIES      OB History    Gravida Para Term Preterm AB Living   4 3 3  0 0 3   SAB TAB Ectopic Multiple Live Births   0 0 0 0 3       Home Medications    Prior to Admission medications   Medication Sig Start Date End Date Taking? Authorizing Provider  Prenatal Vit-Fe Fumarate-FA (PRENATAL COMPLETE PO) Take by mouth.   Yes [provider]    Family History Family History  Problem Relation Age of Onset  . Hypertension Paternal  Grandmother   . Diabetes Maternal Grandfather     Social History Social History  Substance Use Topics  . Smoking status: Former Smoker    Packs/day: 0.50    Years: 3.00    Types: Cigarettes    Quit date: 11/06/2016  . Smokeless tobacco: Never Used  . Alcohol use No     Allergies   Percocet [oxycodone-acetaminophen]   Review of Systems Review of Systems  Constitutional: Negative for fever.  Gastrointestinal: Negative for abdominal pain and vomiting.  Genitourinary: Positive for vaginal bleeding.  Allergic/Immunologic: Negative for immunocompromised state.  Neurological: Negative for syncope.  Hematological: Does not bruise/bleed easily.  All other systems reviewed and are negative.    Physical Exam Updated Vital Signs BP (!) 142/59 (BP Location: Right Arm)   Pulse 91   Temp 98.1 F (36.7 C) (Oral)   Resp 18   Ht 5\' 4"  (1.626 m)   Wt 98 kg (216 lb)   LMP 02/15/2017 (Exact Date)   SpO2 100%   BMI 37.08 kg/m   Physical Exam Physical Exam  Nursing note and vitals reviewed. Constitutional: Well developed, well nourished, non-toxic, and in no acute distress Head: Normocephalic and atraumatic.  Mouth/Throat: Oropharynx is clear and moist.  Neck: Normal range of motion. Neck supple.  Cardiovascular: Normal rate and regular rhythm.   Pulmonary/Chest: Effort normal and breath sounds normal.  Abdominal: Soft. There is no tenderness. There is no rebound  and no guarding.  Musculoskeletal: Normal range of motion.  Neurological: Alert, no facial droop, fluent speech, moves all extremities symmetrically Skin: Skin is warm and dry.  Psychiatric: Cooperative Pelvic: Normal external genitalia. Normal internal genitalia. No discharge. Small amount of blood in the vagina. No significant active bleeding from cervix.    ED Treatments / Results  Labs (all labs ordered are listed, but only abnormal results are displayed) Labs Reviewed - No data to display  EKG  EKG  Interpretation None       Radiology No results found.  Procedures Procedures (including critical care time) EMERGENCY DEPARTMENT US PREGNANCY "Study: Limited Ultrasound of the Pelvis for Pregnancy"  INDICATIONS:Pregnancy(required) and Vaginal bleeding Multiple views of the uterus and pelvic cavity were obtained in real-time with a multi-frequency probe.  APPROACH:Transabdominal  PERFORMED BY: Myself IMAGES ARCHIVED?: No LIMITATIONS: none PREGNANCY FREE FLUID: None ADNEXAL FINDINGS: not evaluated GESTATIONAL AGE, ESTIMATE: n/a FETAL HEART RATE: n/a INTERPRETATION: Only gestational sac noted     Medications Ordered in ED Medications - No data to display   Initial Impression / Assessment and Plan / ED Course  I have reviewed the triage vital signs and the nursing notes.  Pertinent labs & imaging results that were available during my care of the patient were reviewed by me and considered in my medical decision making (see chart for details).     Records reviewed. Patient Rh positive during previous delivery 06/2015. Does not need Rhogam.  She had recent US 04/27/2017 showing non-viable pregnancy due to improper progression of pregnancy.   Long discussion with patient regarding her ultrasound results. Her vaginal bleeding over past day likely suggest impending miscarriage. Discussed options: expectant management, medication induction, or D&C as was previously offered by [facility]. Also spoke with Dr. Macon LargeAnyanwu, who provided instructions for medication induction with cytotec if she chose this option. Patient ultimately requested D&C. She is given instructions to follow-up closely with [facility] to get this done. Strict follow-up and return instructions reviewed. She expected understanding of all discharge instructions, and felt comfortable with the plan of care.  Final Clinical Impressions(s) / ED Diagnoses   Final diagnoses:  Vaginal bleeding  Inevitable complete  miscarriage without complication    New Prescriptions New Prescriptions   No medications on file     Lavera GuiseLiu, Dana Duo, MD 05/13/17 1407

## 2017-05-14 ENCOUNTER — Ambulatory Visit: Payer: Medicaid Other | Admitting: Obstetrics and Gynecology

## 2017-05-17 ENCOUNTER — Encounter: Payer: Medicaid Other | Admitting: Obstetrics & Gynecology

## 2017-05-17 ENCOUNTER — Encounter: Payer: Self-pay | Admitting: Obstetrics & Gynecology

## 2017-05-17 ENCOUNTER — Emergency Department (HOSPITAL_COMMUNITY)
Admission: EM | Admit: 2017-05-17 | Discharge: 2017-05-17 | Disposition: A | Discharge status: Home / Self Care | Payer: Medicaid Other | Attending: Emergency Medicine | Admitting: Emergency Medicine

## 2017-05-17 ENCOUNTER — Encounter (HOSPITAL_COMMUNITY): Payer: Self-pay | Admitting: Emergency Medicine

## 2017-05-17 DIAGNOSIS — Z87891 Personal history of nicotine dependence: Secondary | ICD-10-CM | POA: Insufficient documentation

## 2017-05-17 DIAGNOSIS — N938 Other specified abnormal uterine and vaginal bleeding: Secondary | ICD-10-CM | POA: Diagnosis present

## 2017-05-17 DIAGNOSIS — O039 Complete or unspecified spontaneous abortion without complication: Secondary | ICD-10-CM | POA: Diagnosis not present

## 2017-05-17 MED ORDER — HYDROCODONE-ACETAMINOPHEN 5-325 MG PO TABS: 1.0000 | ORAL_TABLET | ORAL | 0 refills | Status: DC | PRN

## 2017-05-17 MED ORDER — HYDROCODONE-ACETAMINOPHEN 5-325 MG PO TABS
1.0000 | ORAL_TABLET | Freq: Once | ORAL | Status: AC
  Administered 2017-05-17: 1 via ORAL
  Filled 2017-05-17: qty 1

## 2017-05-17 MED ORDER — IBUPROFEN 400 MG PO TABS
600.0000 mg | ORAL_TABLET | Freq: Once | ORAL | Status: AC
  Administered 2017-05-17: 600 mg via ORAL
  Filled 2017-05-17: qty 2

## 2017-05-17 NOTE — ED Triage Notes (Signed)
Pt was seen here last week for miscarriage.  Pt states having a scheduled D&C today but decided to come to ED. Pt states " I have been bleeding since this morning and it keeps getting worse"

## 2017-05-17 NOTE — ED Notes (Signed)
Dr. Kohut at bedside 

## 2017-05-18 ENCOUNTER — Encounter: Payer: Medicaid Other | Admitting: Obstetrics and Gynecology

## 2017-05-18 ENCOUNTER — Ambulatory Visit (INDEPENDENT_AMBULATORY_CARE_PROVIDER_SITE_OTHER): Payer: Medicaid Other | Admitting: Obstetrics and Gynecology

## 2017-05-18 ENCOUNTER — Encounter: Payer: Self-pay | Admitting: Obstetrics and Gynecology

## 2017-05-18 VITALS — BP 128/70 | HR 72 | Ht 64.0 in | Wt 210.0 lb

## 2017-05-18 DIAGNOSIS — O200 Threatened abortion: Secondary | ICD-10-CM | POA: Diagnosis not present

## 2017-05-18 NOTE — Telephone Encounter (Signed)
Pt did not return call. Pt was seen today in clinic by [facility].

## 2017-05-18 NOTE — Progress Notes (Signed)
This encounter was created in error - please disregard.

## 2017-05-18 NOTE — Progress Notes (Signed)
Obstetrics & Gynecology Office Visit   Chief Complaint:  Chief Complaint  Patient presents with  . ER follow up    SAB    History of Present Illness: 26 yo .[id number] presenting for second opinion of failed pregnancy.  Based on ultrasound 2 weeks no FHT documented on follow up ultrasound although there was some development of internal structures.  Abnormal rise of HCG as well.  She has had bleeding and cramping.  Passed tissue in the past 48-hrs.  Bleeding has since slowed down, and cramping subsided.  No risk factors for miscarriage, no history of prior miscarriage.     Review of Systems: 10 point review of systems negative unless otherwise noted in HPI  Past Medical History:  Past Medical History:  Diagnosis Date  . Herpes genitalis   . History of substance abuse   . Hx of preeclampsia, prior pregnancy,     Past Surgical History:  Past Surgical History:  Procedure Laterality Date  . NO PAST SURGERIES      Gynecologic History: Patient's last menstrual period was 02/15/2017 (exact date).  Obstetric History: [id number]  Family History:  Family History  Problem Relation Age of Onset  . Hypertension Paternal Grandmother   . Diabetes Maternal Grandfather     Social History:  Social History   Social History  . Marital status: Divorced    Spouse name: N/A  . Number of children: N/A  . Years of education: N/A   Occupational History  . Not on file.   Social History Main Topics  . Smoking status: Former Smoker    Packs/day: 0.50    Years: 3.00    Types: Cigarettes    Quit date: 11/06/2016  . Smokeless tobacco: Never Used  . Alcohol use No  . Drug use: No  . Sexual activity: Yes     Comment: no contreceptive or condom use    Other Topics Concern  . Not on file   Social History Narrative  . No narrative on file    Allergies:  Allergies  Allergen Reactions  . Percocet [Oxycodone-Acetaminophen] Itching    Medications: Prior to Admission medications     Medication Sig Start Date End Date Taking? Authorizing Provider  HYDROcodone-acetaminophen (NORCO/VICODIN) 5-325 MG tablet Take 1-2 tablets by mouth every 4 (four) hours as needed. 05/17/17   Raeford Razor, MD  Prenatal Vit-Fe Fumarate-FA (PRENATAL COMPLETE PO) Take by mouth.    [provider]    Physical Exam Vitals:  Vitals:   05/18/17 1428  BP: 128/70  Pulse: 72   Patient's last menstrual period was 02/15/2017 (exact date).  General: NAD HEENT: normocephalic, anicteric Thyroid: no enlargement, no palpable nodules Pulmonary: No increased work of breathing Cardiovascular: RRR, distal pulses 2+ Abdomen: NABS, soft, non-tender, non-distended.  Umbilicus without lesions.  No hepatomegaly, splenomegaly or masses palpable. No evidence of hernia  Genitourinary:  External: Normal external female genitalia.  Normal urethral meatus, normal  Bartholin's and Skene's glands.    Vagina: Normal vaginal mucosa, no evidence of prolapse.    Cervix: Grossly normal in appearance, no bleeding  Uterus: Non-enlarged, mobile, normal contour.  No CMT  Adnexa: ovaries non-enlarged, no adnexal masses  Rectal: deferred  Lymphatic: no evidence of inguinal lymphadenopathy Extremities: no edema, erythema, or tenderness Neurologic: Grossly intact Psychiatric: mood appropriate, affect full  Female chaperone present for pelvic and breast  portions of the physical exam  Assessment: 26 y.o. [id number] threatened OB but likely complete abortion based on symptoms  Plan: Problem List Items Addressed This Visit    None    Visit Diagnoses    Threatened abortion    -  Primary   Relevant Orders   US OB Comp Less 14 Wks   HCG, quantitative, pregnancy   Beta HCG, Quant (Completed)      "Society of [employer] in Ultrasound Guidelines for Transvaginal Ultrasonographic Diagnosis of Early Pregnancy Loss" and adopted in ACOG Practice Bulletin Number 150, May 2015 (reaffirmed 2017) "Early Pregnancy  Loss"  - CRL of 7mm or greater and absence of fetal heartbeat  - Mean sac diameter of 25mm or greater and no embryo  - Absence of embryo with a discernable heartbeat 2 week after initial ultrasound showing a gestational sac without a yolk sac  - Absence of embryo with a discernable heartbeat 11 days after initial ultrasound showing a gestational sac with  yolk sac present   Condolences were offered to the patient and her family.  I stressed that while emotionally difficult, that this did not occur because of an actions or inactions by the patient.  Somewhere between 10-20% of identified first trimester pregnancies will unfortunately end in miscarriage.  Given this relatively high incidence rate, further diagnostic testing such as chromosome analysis is generally not clinically relevant nor recommended.  Although the chromosomal abnormalities have been implicated at rates as high as 70% in some studies, these are generally random and do not infer and increased risk of recurrence with subsequent pregnancies.  However, 3 or more consecutive first trimester losses are relatively uncommon, and these patient generally do benefit from additional work up to determine a potential modifiable etiology.   We briefly discussed management options including expectant management, medical management, and surgical management as well as their relative success rates and complications. Approximately 80% of first trimester miscarriages will pass successfully but may require a time frame of up to 8 weeks (ACOG Practice Bulletin 150 May 2015 "Early Pregnancy Loss").  Medical management using 800mcg of misoprostil administered every 3-hrs as needed for up to 3 doses speeds up the time frame to completion significantly, has literature supporting its use up to 63 days or 7150w0d gestation and results in a passage rate of 84-85% (ACOG Practice Bulletin 143 March 2014 "Medical Management of First-Trimester Abortion").  Dilation and  curettage has the highest rate of uterine evacuation, but carries with is operative cost, surgical and anesthetic risk.  While these risk are relatively small they nevertheless include infection, bleeding, uterine perforation, formation of uterine synechia, and in rare cases death.  I suspect the patient has already completed. Will trend HCG levels if the patient obtaine repeat ultrasound to verify uterine evacuation.   Clinically I am confident of the diagnosis, but I do not want any doubts in the patient's mind regarding the plan of management she chooses to adopt.  I will allow the patient and her family to discuss management options and she was advised to contact the office to arrange final disposition one she has made her decision or should she have any follow up questions for myself.    A total of 15 minutes were spent in face-to-face contact with the patient during this encounter with over half of that time devoted to counseling and coordination of care.     - repeat HCG - repeat US in 1 week - A total of 15 minutes were spent in face-to-face contact with the patient during this encounter with over half of that time devoted to counseling and  coordination of care.

## 2017-05-19 ENCOUNTER — Encounter: Payer: Self-pay | Admitting: Obstetrics and Gynecology

## 2017-05-19 LAB — BETA HCG QUANT (REF LAB): hCG Quant: 470 m[IU]/mL

## 2017-05-24 ENCOUNTER — Ambulatory Visit (INDEPENDENT_AMBULATORY_CARE_PROVIDER_SITE_OTHER): Payer: Medicaid Other | Admitting: Advanced Practice Midwife

## 2017-05-24 ENCOUNTER — Ambulatory Visit (INDEPENDENT_AMBULATORY_CARE_PROVIDER_SITE_OTHER): Payer: Medicaid Other

## 2017-05-24 ENCOUNTER — Encounter: Payer: Self-pay | Admitting: Advanced Practice Midwife

## 2017-05-24 VITALS — BP 124/74 | Ht 64.0 in | Wt 210.0 lb

## 2017-05-24 DIAGNOSIS — O200 Threatened abortion: Secondary | ICD-10-CM | POA: Diagnosis not present

## 2017-05-24 DIAGNOSIS — O039 Complete or unspecified spontaneous abortion without complication: Secondary | ICD-10-CM | POA: Diagnosis not present

## 2017-05-24 NOTE — Progress Notes (Signed)
S: Patient is here for f/u u/s for miscarriage. She has been bleeding heavily at times with clots and cramping for about a week. Today it is not as heavy.   O: Beta hcg quant is trending down from 6,207 on 6/8 to 407 on 7/13.  Vital Signs: BP 124/74   Ht 5\' 4"  (1.626 m)   Wt 210 lb (95.3 kg)   LMP 02/15/2017 (Exact Date)   BMI 36.05 kg/m  Constitutional: Well nourished, well developed female in no acute distress.  HEENT: normal Skin: Warm and dry.  Cardiovascular: Regular rate and rhythm.   Respiratory: Clear to auscultation bilateral. Normal respiratory effort Abdomen: soft, nontender, nondistended, no abnormal masses, no epigastric pain Psych: Alert and Oriented x3. No memory deficits. Normal mood and affect.  MS: normal gait, normal bilateral lower extremity ROM/strength/stability.  Pelvic exam: deferred U/S results Results for Mary Terry, Mary Terry (MRN [id number]) as of 05/24/2017 16:34  Ref. Range 05/24/2017 15:22  US OB TRANSVAGINAL Unknown Rpt  Report says tiny gestational sac without fetal pole of yolk sac and most likely miscarriage in progress.  A: 26 y.o. Female in process of having a miscarriage  P: F/U as needed if bleeding does not begin to taper in the next few days or if pain worsens or s/s infection   Tresea MallJane Delrico Terry, CNM

## 2017-05-26 NOTE — ED Provider Notes (Signed)
[facility]-EMERGENCY DEPT Provider Note   CSN: [id number] Arrival date & time: 05/17/17  1632     History   Chief Complaint Chief Complaint  Patient presents with  . Vaginal Bleeding    HPI Mary Terry is a 26 y.o. female.  HPI   25yF with vaginal bleeding. Recently diagnosed missed abortion. Was actually supposed to have D&C today but left office because she was upset over the wait. She is actually having vaginal bleeding now and passing clots. Describes as heavier than a period. Some crampy lower abdominal and back pain. No fever or chills. No dizziness or lightheadedness.   Past Medical History:  Diagnosis Date  . Herpes genitalis   . History of substance abuse   . Hx of preeclampsia, prior pregnancy,     Patient Active Problem List   Diagnosis Date Noted  . Pregnant 04/06/2017  . Hx of preeclampsia, prior pregnancy, currently pregnant 04/06/2017  . Tobacco use disorder 03/10/2016  . Cannabis use disorder, mild, abuse 03/10/2016  . Suicidal ideation 03/09/2016  . Alcohol use disorder, mild, abuse 03/09/2016  . Major depressive disorder, recurrent, 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  . Mild cardiomegaly 07/13/2015  . Renal insufficiency 07/13/2015    Past Surgical History:  Procedure Laterality Date  . NO PAST SURGERIES      OB History    Gravida Para Term Preterm AB Living   4 3 3  0 0 3   SAB TAB Ectopic Multiple Live Births   0 0 0 0 3       Home Medications    Prior to Admission medications   Medication Sig Start Date End Date Taking? Authorizing Provider  HYDROcodone-acetaminophen (NORCO/VICODIN) 5-325 MG tablet Take 1-2 tablets by mouth every 4 (four) hours as needed. 05/17/17   Raeford Razor, MD  Prenatal Vit-Fe Fumarate-FA (PRENATAL COMPLETE PO) Take by mouth.    [provider]    Family History Family History  Problem Relation Age of Onset  . Hypertension Paternal Grandmother   . Diabetes Maternal Grandfather      Social History Social History  Substance Use Topics  . Smoking status: Former Smoker    Packs/day: 0.50    Years: 3.00    Types: Cigarettes    Quit date: 11/06/2016  . Smokeless tobacco: Never Used  . Alcohol use No     Allergies   Percocet [oxycodone-acetaminophen]   Review of Systems Review of Systems   Physical Exam Updated Vital Signs BP 101/66 (BP Location: Right Arm)   Pulse 71   Temp 98 F (36.7 C) (Oral)   Resp 16   Ht 5\' 4"  (1.626 m)   Wt 95.7 kg (211 lb)   LMP 02/15/2017 (Exact Date)   SpO2 100%   BMI 36.22 kg/m   Physical Exam  Constitutional: She appears well-developed and well-nourished. No distress.  HENT:  Head: Normocephalic and atraumatic.  Eyes: Conjunctivae are normal. Right eye exhibits no discharge. Left eye exhibits no discharge.  Neck: Neck supple.  Cardiovascular: Normal rate, regular rhythm and normal heart sounds.  Exam reveals no gallop and no friction rub.   No murmur heard. Pulmonary/Chest: Effort normal and breath sounds normal. No respiratory distress.  Abdominal: Soft. She exhibits no distension.  Minimal suprapubic tenderness.  Musculoskeletal: She exhibits no edema or tenderness.  Neurological: She is alert.  Skin: Skin is warm and dry.  Psychiatric: She has a normal mood and affect. Her behavior is normal. Thought content normal.  Nursing  note and vitals reviewed.    ED Treatments / Results  Labs (all labs ordered are listed, but only abnormal results are displayed) Labs Reviewed - No data to display  EKG  EKG Interpretation None       Radiology No results found.  Procedures Procedures (including critical care time)  Medications Ordered in ED Medications  ibuprofen (ADVIL,MOTRIN) tablet 600 mg (600 mg Oral Given 05/17/17 1833)  HYDROcodone-acetaminophen (NORCO/VICODIN) 5-325 MG per tablet 1 tablet (1 tablet Oral Given 05/17/17 1833)     Initial Impression / Assessment and Plan / ED Course  I have  reviewed the triage vital signs and the nursing notes.  Pertinent labs & imaging results that were available during my care of the patient were reviewed by me and considered in my medical decision making (see chart for details).     Sounds like now passing products of conception. Not much more I can do in the ED. Minimal pain. No fever. HD stable. OB/GYN FU.   Final Clinical Impressions(s) / ED Diagnoses   Final diagnoses:  Miscarriage    New Prescriptions Discharge Medication List as of 05/17/2017  6:12 PM    START taking these medications   Details  HYDROcodone-acetaminophen (NORCO/VICODIN) 5-325 MG tablet Take 1-2 tablets by mouth every 4 (four) hours as needed., Starting Thu 05/17/2017, Print         Raeford RazorKohut, Zanobia Griebel, MD 05/26/17 1946

## 2017-10-23 DIAGNOSIS — A599 Trichomoniasis, unspecified: Secondary | ICD-10-CM | POA: Diagnosis not present

## 2017-10-23 DIAGNOSIS — N390 Urinary tract infection, site not specified: Secondary | ICD-10-CM | POA: Diagnosis not present

## 2017-10-24 ENCOUNTER — Encounter: Payer: Self-pay | Admitting: Obstetrics and Gynecology

## 2017-10-24 ENCOUNTER — Ambulatory Visit (INDEPENDENT_AMBULATORY_CARE_PROVIDER_SITE_OTHER): Payer: Medicaid Other | Admitting: Obstetrics and Gynecology

## 2017-10-24 VITALS — BP 122/74 | Ht 64.0 in | Wt 212.0 lb

## 2017-10-24 DIAGNOSIS — N760 Acute vaginitis: Secondary | ICD-10-CM

## 2017-10-24 DIAGNOSIS — R300 Dysuria: Secondary | ICD-10-CM

## 2017-10-24 DIAGNOSIS — A599 Trichomoniasis, unspecified: Secondary | ICD-10-CM | POA: Diagnosis not present

## 2017-10-24 MED ORDER — METRONIDAZOLE 500 MG PO TABS: 500.0000 mg | ORAL_TABLET | Freq: Two times a day (BID) | ORAL | 0 refills | Status: AC

## 2017-10-24 NOTE — Progress Notes (Signed)
Patient ID: Mary Terry, female   DOB: August 13, 1991, 26 y.o.   MRN: [id number]  Reason for Consult: Exposure to STD   Referred by No ref. provider found  Subjective:     HPI:  Mary Terry is a 26 y.o. female [phone] Patient presents for follow up treatment from the urgent care. She says that she was diagnosed with a UTI and trichomonas. They gave her antibiotics ( cefdinir) and told her that they wouldn't treat her for the trichomonas. Patient also was told she was pregnant. She intends to establish pregnancy care with [facility]. She would like to be tested today for chlamydia and gonorrhea as well. Patient has been having dysuria and vaginal irritation. Records obtained from [facility] reflect this information.    Past Medical History:  Diagnosis Date  . Herpes genitalis   . History of substance abuse   . Hx of preeclampsia, prior pregnancy,    Family History  Problem Relation Age of Onset  . Hypertension Paternal Grandmother   . Diabetes Maternal Grandfather    Past Surgical History:  Procedure Laterality Date  . NO PAST SURGERIES      Short Social History:  Social History   Tobacco Use  . Smoking status: Former Smoker    Packs/day: 0.50    Years: 3.00    Pack years: 1.50    Types: Cigarettes    Last attempt to quit: 11/06/2016    Years since quitting: 0.9  . Smokeless tobacco: Never Used  Substance Use Topics  . Alcohol use: No    Allergies  Allergen Reactions  . Percocet [Oxycodone-Acetaminophen] Itching    Current Outpatient Medications  Medication Sig Dispense Refill  . cefdinir (OMNICEF) 300 MG capsule Take 300 mg by mouth 2 (two) times daily.    . Prenatal Vit-Fe Fumarate-FA (PRENATAL COMPLETE PO) Take by mouth.    Marland Kitchen. HYDROcodone-acetaminophen (NORCO/VICODIN) 5-325 MG tablet Take 1-2 tablets by mouth every 4 (four) hours as needed. (Patient not taking: Reported on 10/24/2017) 10 tablet 0  . metroNIDAZOLE (FLAGYL) 500 MG tablet Take 1 tablet (500 mg  total) by mouth 2 (two) times daily for 7 days. 14 tablet 0   No current facility-administered medications for this visit.     Review of Systems  Constitutional: Negative for chills, fatigue, fever and unexpected weight change.  HENT: Negative for trouble swallowing.  Eyes: Negative for loss of vision.  Respiratory: Negative for cough, shortness of breath and wheezing.  Cardiovascular: Negative for chest pain, leg swelling, palpitations and syncope.  GI: Negative for abdominal pain, blood in stool, diarrhea, nausea and vomiting.  GU: Positive for dysuria and frequency. Negative for difficulty urinating and hematuria.  Musculoskeletal: Negative for back pain, leg pain and joint pain.  Skin: Negative for rash.  Neurological: Negative for dizziness, headaches, light-headedness, numbness and seizures.  Psychiatric: Negative for behavioral problem, confusion, depressed mood and sleep disturbance.        Objective:  Objective   Vitals:   10/24/17 0952  BP: 122/74  Weight: 212 lb (96.2 kg)  Height: 5\' 4"  (1.626 m)   Body mass index is 36.39 kg/m.  Physical Exam  Constitutional: She is oriented to person, place, and time. She appears well-developed and well-nourished.  HENT:  Head: Normocephalic and atraumatic.  Cardiovascular: Normal rate and regular rhythm.  Pulmonary/Chest: Effort normal.  Abdominal: Soft. Bowel sounds are normal.  Genitourinary: Vagina normal. There is no rash, tenderness, lesion or injury on the right labia. There is  no rash, tenderness, lesion or injury on the left labia.  Genitourinary Comments: Frothy white vaginal discharge  Neurological: She is alert and oriented to person, place, and time.  Skin: Skin is warm and dry.  Vitals reviewed.  Microscopic wet-mount exam shows clue cells, white blood cells.       Assessment/Plan:    A/P 26 yo with vaginitis and urinary tract infection  Sent prescription for flagyl to the pharmacy. Will send NuSwab and  contact patient with results. Urine culture sent.  Encouraged patient to establish pregnancy care.     Natale Milchhristanna R Schuman MD 10/24/17 12:32 PM

## 2017-10-29 ENCOUNTER — Other Ambulatory Visit: Payer: Self-pay | Admitting: Obstetrics and Gynecology

## 2017-10-29 DIAGNOSIS — N760 Acute vaginitis: Secondary | ICD-10-CM

## 2017-10-29 LAB — URINE CULTURE

## 2017-10-29 LAB — NUSWAB VAGINITIS PLUS (VG+)
CANDIDA GLABRATA, NAA: NEGATIVE
CHLAMYDIA TRACHOMATIS, NAA: NEGATIVE
Candida albicans, NAA: POSITIVE — AB
Neisseria gonorrhoeae, NAA: NEGATIVE
TRICH VAG BY NAA: POSITIVE — AB

## 2017-10-29 MED ORDER — TERCONAZOLE 0.4 % VA CREA: 1.0000 | TOPICAL_CREAM | Freq: Every day | VAGINAL | 0 refills | Status: DC

## 2017-10-29 NOTE — Progress Notes (Signed)
Discussed with patient on phone, sent rx for diflucan.

## 2017-11-28 ENCOUNTER — Encounter (HOSPITAL_COMMUNITY): Payer: Self-pay | Admitting: Emergency Medicine

## 2017-11-28 ENCOUNTER — Emergency Department (HOSPITAL_COMMUNITY)
Admission: EM | Admit: 2017-11-28 | Discharge: 2017-11-28 | Disposition: A | Discharge status: Home / Self Care | Payer: Medicaid Other | Attending: Emergency Medicine | Admitting: Emergency Medicine

## 2017-11-28 ENCOUNTER — Other Ambulatory Visit: Payer: Self-pay

## 2017-11-28 DIAGNOSIS — Z79899 Other long term (current) drug therapy: Secondary | ICD-10-CM | POA: Diagnosis not present

## 2017-11-28 DIAGNOSIS — Y929 Unspecified place or not applicable: Secondary | ICD-10-CM | POA: Insufficient documentation

## 2017-11-28 DIAGNOSIS — O9A211 Injury, poisoning and certain other consequences of external causes complicating pregnancy, first trimester: Secondary | ICD-10-CM | POA: Diagnosis not present

## 2017-11-28 DIAGNOSIS — S0990XA Unspecified injury of head, initial encounter: Secondary | ICD-10-CM | POA: Insufficient documentation

## 2017-11-28 DIAGNOSIS — Z87891 Personal history of nicotine dependence: Secondary | ICD-10-CM | POA: Insufficient documentation

## 2017-11-28 DIAGNOSIS — Y939 Activity, unspecified: Secondary | ICD-10-CM | POA: Insufficient documentation

## 2017-11-28 DIAGNOSIS — Y999 Unspecified external cause status: Secondary | ICD-10-CM | POA: Diagnosis not present

## 2017-11-28 DIAGNOSIS — M542 Cervicalgia: Secondary | ICD-10-CM | POA: Diagnosis not present

## 2017-11-28 DIAGNOSIS — Z3A11 11 weeks gestation of pregnancy: Secondary | ICD-10-CM | POA: Insufficient documentation

## 2017-11-28 DIAGNOSIS — T07XXXA Unspecified multiple injuries, initial encounter: Secondary | ICD-10-CM

## 2017-11-28 MED ORDER — ACETAMINOPHEN 500 MG PO TABS
1000.0000 mg | ORAL_TABLET | Freq: Once | ORAL | Status: AC
Start: 2017-11-28 — End: 2017-11-28
  Administered 2017-11-28: 1000 mg via ORAL
  Filled 2017-11-28: qty 2
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
## 2017-11-28 NOTE — ED Notes (Signed)
Pt given fluids to drink

## 2017-11-28 NOTE — Discharge Instructions (Signed)
Tylenol for pain Ice packs ER for worsening symptoms.

## 2017-11-28 NOTE — ED Notes (Addendum)
Pt ambulatory to waiting room. Pt verbalized understanding of discharge instructions.  Pt stated she had a safe place to go after discharge.
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
## 2017-11-28 NOTE — ED Notes (Signed)
MD at bedside with ultrasound. Heart beat noted.

## 2017-12-04 DIAGNOSIS — T7491XA Unspecified adult maltreatment, confirmed, initial encounter: Secondary | ICD-10-CM | POA: Insufficient documentation

## 2018-05-23 DIAGNOSIS — Z3689 Encounter for other specified antenatal screening: Secondary | ICD-10-CM | POA: Diagnosis not present

## 2018-06-20 DIAGNOSIS — Z3A40 40 weeks gestation of pregnancy: Secondary | ICD-10-CM | POA: Diagnosis not present

## 2018-06-20 DIAGNOSIS — O99820 Streptococcus B carrier state complicating pregnancy: Secondary | ICD-10-CM | POA: Diagnosis not present

## 2018-07-22 DIAGNOSIS — R300 Dysuria: Secondary | ICD-10-CM | POA: Diagnosis not present

## 2018-07-22 DIAGNOSIS — N390 Urinary tract infection, site not specified: Secondary | ICD-10-CM | POA: Diagnosis not present

## 2018-12-17 ENCOUNTER — Encounter (HOSPITAL_COMMUNITY): Payer: Self-pay | Admitting: *Deleted

## 2018-12-17 ENCOUNTER — Other Ambulatory Visit: Payer: Self-pay

## 2018-12-17 ENCOUNTER — Emergency Department (HOSPITAL_COMMUNITY): Payer: Medicaid Other

## 2018-12-17 ENCOUNTER — Emergency Department (HOSPITAL_COMMUNITY)
Admission: EM | Admit: 2018-12-17 | Discharge: 2018-12-17 | Disposition: A | Discharge status: Home / Self Care | Payer: Medicaid Other | Attending: Emergency Medicine | Admitting: Emergency Medicine

## 2018-12-17 DIAGNOSIS — F329 Major depressive disorder, single episode, unspecified: Secondary | ICD-10-CM | POA: Insufficient documentation

## 2018-12-17 DIAGNOSIS — M75101 Unspecified rotator cuff tear or rupture of right shoulder, not specified as traumatic: Secondary | ICD-10-CM | POA: Diagnosis not present

## 2018-12-17 DIAGNOSIS — M7581 Other shoulder lesions, right shoulder: Secondary | ICD-10-CM

## 2018-12-17 DIAGNOSIS — F17210 Nicotine dependence, cigarettes, uncomplicated: Secondary | ICD-10-CM | POA: Diagnosis not present

## 2018-12-17 DIAGNOSIS — F1010 Alcohol abuse, uncomplicated: Secondary | ICD-10-CM | POA: Insufficient documentation

## 2018-12-17 DIAGNOSIS — M25511 Pain in right shoulder: Secondary | ICD-10-CM | POA: Diagnosis not present

## 2018-12-17 MED ORDER — METHOCARBAMOL 500 MG PO TABS: 500.0000 mg | ORAL_TABLET | Freq: Two times a day (BID) | ORAL | 0 refills | Status: DC

## 2018-12-17 MED ORDER — NAPROXEN 500 MG PO TABS: 500.0000 mg | ORAL_TABLET | Freq: Two times a day (BID) | ORAL | 0 refills | Status: DC

## 2018-12-17 MED ORDER — NAPROXEN 250 MG PO TABS
500.0000 mg | ORAL_TABLET | Freq: Once | ORAL | Status: AC
  Administered 2018-12-17: 500 mg via ORAL
  Filled 2018-12-17: qty 2

## 2018-12-17 NOTE — ED Provider Notes (Signed)
[facility] EMERGENCY DEPARTMENT Provider Note   CSN: [id number] Arrival date & time: 12/17/18  4734     History   Chief Complaint Chief Complaint  Patient presents with  . Generalized Body Aches    HPI Mary Terry is a 28 y.o. female.  HPI 28 year old female comes in with chief complaint of shoulder pain.  Patient has history of chronic pain issues, but states that over the past few days her shoulder pain is gotten worse.  Patient states that her pain is worse with any kind of shoulder movement and it is affecting her daily living activities and her job responsibilities.  There is no associated numbness, tingling.  Patient denies any known shoulder injury.  She is also complaining of paraspinal neck pain on both sides and there is no associated numbness, tingling of the shoulder.  She is also requesting outpatient psychiatry follow-up as she has been feeling more more depressed.  There is no SI, HI.  Past Medical History:  Diagnosis Date  . Herpes genitalis   . History of substance abuse (HCC)   . Hx of preeclampsia, prior pregnancy,     Patient Active Problem List   Diagnosis Date Noted  . Pregnant 04/06/2017  . Hx of preeclampsia, prior pregnancy, currently pregnant 04/06/2017  . Tobacco use disorder 03/10/2016  . Cannabis use disorder, mild, abuse 03/10/2016  . Suicidal ideation 03/09/2016  . Alcohol use disorder, mild, abuse 03/09/2016  . Major depressive disorder, recurrent, 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  . Mild cardiomegaly 07/13/2015  . Renal insufficiency 07/13/2015    Past Surgical History:  Procedure Laterality Date  . NO PAST SURGERIES       OB History    Gravida  5   Para  3   Term  3   Preterm  0   AB  1   Living  3     SAB  1   TAB  0   Ectopic  0   Multiple  0   Live Births  3            Home Medications    Prior to Admission medications   Medication Sig Start Date End Date Taking? Authorizing  Provider  acetaminophen (TYLENOL) 325 MG tablet Take 325-500 mg by mouth daily as needed for moderate pain or headache.    [provider]  methocarbamol (ROBAXIN) 500 MG tablet Take 1 tablet (500 mg total) by mouth 2 (two) times daily. 12/17/18   Derwood Kaplan, MD  naproxen (NAPROSYN) 500 MG tablet Take 1 tablet (500 mg total) by mouth 2 (two) times daily. 12/17/18   Derwood Kaplan, MD  Prenatal Vit-Fe Fumarate-FA (PRENATAL COMPLETE PO) Take 1 tablet by mouth daily.     [provider]    Family History Family History  Problem Relation Age of Onset  . Hypertension Paternal Grandmother   . Diabetes Maternal Grandfather     Social History Social History   Tobacco Use  . Smoking status: Current Every Day Smoker    Packs/day: 0.40    Years: 3.00    Pack years: 1.20    Types: Cigarettes    Last attempt to quit: 11/06/2016    Years since quitting: 2.1  . Smokeless tobacco: Never Used  . Tobacco comment: 8 cigarettes daily   Substance Use Topics  . Alcohol use: No  . Drug use: No     Allergies   Percocet [oxycodone-acetaminophen]   Review of Systems Review  of Systems  Constitutional: Positive for activity change.  Musculoskeletal: Positive for arthralgias.  Neurological: Negative for numbness.  Psychiatric/Behavioral: Positive for dysphoric mood. The patient is nervous/anxious.      Physical Exam Updated Vital Signs BP (!) 133/100   Pulse 100   Temp 99.1 F (37.3 C) (Oral)   Resp 18   Ht 5\' 3"  (1.6 m)   Wt 98 kg   LMP 11/21/2018   SpO2 98%   Breastfeeding Unknown   BMI 38.26 kg/m   Physical Exam Vitals signs and nursing note reviewed.  Constitutional:      Appearance: She is well-developed.  HENT:     Head: Normocephalic and atraumatic.  Eyes:     Pupils: Pupils are equal, round, and reactive to light.  Neck:     Musculoskeletal: Neck supple.  Cardiovascular:     Rate and Rhythm: Normal rate and regular rhythm.     Pulses: Normal  pulses.     Heart sounds: Normal heart sounds. No murmur.  Pulmonary:     Effort: Pulmonary effort is normal. No respiratory distress.  Abdominal:     General: There is no distension.     Palpations: Abdomen is soft.     Tenderness: There is no abdominal tenderness. There is no guarding or rebound.  Musculoskeletal:     Comments: Patient has reproducible tenderness over the right shoulder.  She also is noted to have tenderness with shoulder abduction and forward flexion.  Patient is having limitation in internal and external rotation of her shoulder and there is reproducible tenderness with those activities.  Skin:    General: Skin is warm and dry.  Neurological:     Mental Status: She is alert and oriented to person, place, and time.      ED Treatments / Results  Labs (all labs ordered are listed, but only abnormal results are displayed) Labs Reviewed - No data to display  EKG None  Radiology No results found.  Procedures Procedures (including critical care time)  Medications Ordered in ED Medications  naproxen (NAPROSYN) tablet 500 mg (500 mg Oral Given 12/17/18 0957)     Initial Impression / Assessment and Plan / ED Course  I have reviewed the triage vital signs and the nursing notes.  Pertinent labs & imaging results that were available during my care of the patient were reviewed by me and considered in my medical decision making (see chart for details).     28 year old woman comes in a chief complaint of shoulder pain.  It appears that her pain is chronic in nature but has progressed over the past few days.  She clearly appears to be having shoulder tendinitis.  There is no midline C-spine tenderness and I do not think she has severe arthritic of her neck that is causing her symptoms.  We will get shoulder x-rays and have her follow-up with orthopedics.  Additionally patient wants a psychiatry follow-up which will be provided. We will start her on Naprosyn twice a  day along with rice treatment.  Final Clinical Impressions(s) / ED Diagnoses   Final diagnoses:  Tendinitis of right rotator cuff    ED Discharge Orders         Ordered    naproxen (NAPROSYN) 500 MG tablet  2 times daily     12/17/18 1003    methocarbamol (ROBAXIN) 500 MG tablet  2 times daily     12/17/18 1003           Sephiroth Mcluckie,  Euel Castile, MD 12/17/18 1008

## 2018-12-17 NOTE — ED Triage Notes (Addendum)
Pt c/o pain all over, specially to right shoulder, right wrist, neck and back x 3 years. Pt reports she used to be on medication to treat the pain but hasn't been on the medication for awhile because she felt like "they really weren't working".   Pt also requesting to be started back on her bipolar medications.

## 2018-12-17 NOTE — ED Notes (Signed)
ED Provider at bedside. 

## 2018-12-17 NOTE — ED Notes (Signed)
Patient transported to X-ray 

## 2018-12-17 NOTE — Discharge Instructions (Signed)
We saw in the ER for shoulder pain and weakness.  It appears to [place] that your symptoms are because of rotator cuff tendinitis or a shoulder impingement syndrome. It is prudent that you follow-up with orthopedist for further evaluation and management. Also provided is the contact number for psychiatrist at your request.  Please follow the rice treatment (read the attached instructions).

## 2019-02-13 DIAGNOSIS — K529 Noninfective gastroenteritis and colitis, unspecified: Secondary | ICD-10-CM | POA: Diagnosis not present

## 2019-03-25 ENCOUNTER — Ambulatory Visit
Admission: EM | Admit: 2019-03-25 | Discharge: 2019-03-25 | Disposition: A | Discharge status: Home / Self Care | Payer: Medicaid Other | Attending: Emergency Medicine | Admitting: Emergency Medicine

## 2019-03-25 ENCOUNTER — Other Ambulatory Visit: Payer: Self-pay

## 2019-03-25 DIAGNOSIS — S46812A Strain of other muscles, fascia and tendons at shoulder and upper arm level, left arm, initial encounter: Secondary | ICD-10-CM

## 2019-03-25 DIAGNOSIS — M62838 Other muscle spasm: Secondary | ICD-10-CM

## 2019-03-25 MED ORDER — METHOCARBAMOL 500 MG PO TABS: 500.0000 mg | ORAL_TABLET | Freq: Every evening | ORAL | 0 refills | Status: DC | PRN

## 2019-03-25 MED ORDER — NAPROXEN 500 MG PO TABS: 500.0000 mg | ORAL_TABLET | Freq: Two times a day (BID) | ORAL | 0 refills | Status: DC

## 2019-03-25 NOTE — ED Triage Notes (Signed)
Pt was moving furniture on Friday when she heard a pop in neck and has had neck and upper back pain

## 2019-03-25 NOTE — Discharge Instructions (Signed)
Continue conservative management of rest, ice, heat, and gentle stretches Take naproxen as needed for pain relief (may cause abdominal discomfort, ulcers, and GI bleeds avoid taking with other NSAIDs) Take robaxin at nighttime for symptomatic relief. Avoid driving or operating heavy machinery while using medication. Follow up with a local PCP to establish care and for further evaluation and management as needed Return or go to the ER if you have any new or worsening symptoms (fever, chills, chest pain, abdominal pain, changes in bowel or bladder habits, pain radiating into lower legs, etc...)
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
## 2019-03-27 ENCOUNTER — Other Ambulatory Visit: Payer: Self-pay

## 2019-03-27 ENCOUNTER — Emergency Department (HOSPITAL_COMMUNITY)
Admission: EM | Admit: 2019-03-27 | Discharge: 2019-03-27 | Disposition: A | Discharge status: Home / Self Care | Payer: Medicaid Other | Attending: Emergency Medicine | Admitting: Emergency Medicine

## 2019-03-27 ENCOUNTER — Encounter (HOSPITAL_COMMUNITY): Payer: Self-pay | Admitting: Emergency Medicine

## 2019-03-27 DIAGNOSIS — Y999 Unspecified external cause status: Secondary | ICD-10-CM | POA: Diagnosis not present

## 2019-03-27 DIAGNOSIS — S161XXA Strain of muscle, fascia and tendon at neck level, initial encounter: Secondary | ICD-10-CM | POA: Diagnosis not present

## 2019-03-27 DIAGNOSIS — Y929 Unspecified place or not applicable: Secondary | ICD-10-CM | POA: Insufficient documentation

## 2019-03-27 DIAGNOSIS — X58XXXA Exposure to other specified factors, initial encounter: Secondary | ICD-10-CM | POA: Insufficient documentation

## 2019-03-27 DIAGNOSIS — Y939 Activity, unspecified: Secondary | ICD-10-CM | POA: Insufficient documentation

## 2019-03-27 DIAGNOSIS — F17210 Nicotine dependence, cigarettes, uncomplicated: Secondary | ICD-10-CM | POA: Insufficient documentation

## 2019-03-27 DIAGNOSIS — S199XXA Unspecified injury of neck, initial encounter: Secondary | ICD-10-CM | POA: Diagnosis present

## 2019-03-27 MED ORDER — CYCLOBENZAPRINE HCL 10 MG PO TABS: 10.0000 mg | ORAL_TABLET | Freq: Three times a day (TID) | ORAL | 0 refills | Status: DC | PRN

## 2019-03-27 MED ORDER — DICLOFENAC SODIUM 1 % TD GEL: 2.0000 g | Freq: Four times a day (QID) | TRANSDERMAL | 0 refills | Status: DC | PRN

## 2019-03-27 NOTE — ED Triage Notes (Signed)
Patient c/o neck and back pain. Seen here previously for same. Patient given medication, states she is allergic to the meds.

## 2019-03-27 NOTE — Discharge Instructions (Signed)

## 2019-03-27 NOTE — ED Provider Notes (Signed)
Emergency Department Provider Note   I have reviewed the triage vital signs and the nursing notes.   HISTORY  Chief Complaint Neck Pain   HPI Mary Terry is a 28 y.o. female with PMH of neck pain presents to the ED with continued neck pain. Patient seen recently in the ED with neck pain. Notes Mary Terry history of similar symptoms. No injury. No numbness/weakness in the arms/hands. No HA. No fever. Patient reports intolerance to Robaxin. She specifically states that her listed allergy to Percocet is incorrect and "I just need something for my pain now and then I can follow up."   Past Medical History:  Diagnosis Date  . Herpes genitalis   . History of substance abuse (HCC)   . Hx of preeclampsia, prior pregnancy,     Patient Active Problem List   Diagnosis Date Noted  . Pregnant 04/06/2017  . Hx of preeclampsia, prior pregnancy, currently pregnant 04/06/2017  . Tobacco use disorder 03/10/2016  . Cannabis use disorder, mild, abuse 03/10/2016  . Suicidal ideation 03/09/2016  . Alcohol use disorder, mild, abuse 03/09/2016  . Major depressive disorder, recurrent, 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  . Mild cardiomegaly 07/13/2015  . Renal insufficiency 07/13/2015    Past Surgical History:  Procedure Laterality Date  . NO PAST SURGERIES      Allergies Percocet [oxycodone-acetaminophen]  Family History  Problem Relation Age of Onset  . Hypertension Paternal Grandmother   . Diabetes Maternal Grandfather     Social History Social History   Tobacco Use  . Smoking status: Current Every Day Smoker    Packs/day: 0.40    Years: 3.00    Pack years: 1.20    Types: Cigarettes    Last attempt to quit: 11/06/2016    Years since quitting: 2.3  . Smokeless tobacco: Never Used  . Tobacco comment: 8 cigarettes daily   Substance Use Topics  . Alcohol use: No  . Drug use: No    Review of Systems  Constitutional: No fever/chills Genitourinary: Negative for  dysuria. Musculoskeletal: Negative for back pain. Positive neck pain.  Skin: Negative for rash. Neurological: Negative for headaches, focal weakness or numbness.  10-point ROS otherwise negative.  ____________________________________________   PHYSICAL EXAM:  VITAL SIGNS: ED Triage Vitals  Enc Vitals Group     BP 03/27/19 1234 (!) 154/89     Pulse Rate 03/27/19 1234 91     Resp 03/27/19 1234 (!) 22     Temp 03/27/19 1234 98.2 F (36.8 C)     Temp Source 03/27/19 1234 Oral     SpO2 03/27/19 1234 100 %     Weight 03/27/19 1230 220 lb (99.8 kg)     Height 03/27/19 1230 5\' 4"  (1.626 m)     Pain Score 03/27/19 1229 10   Constitutional: Alert and oriented. Well appearing and in no acute distress. Eyes: Conjunctivae are normal.. Head: Atraumatic. Nose: No congestion/rhinnorhea. Mouth/Throat: Mucous membranes are moist.  Neck: No stridor. No cervical spine tenderness to palpation. Cardiovascular: Normal rate, regular rhythm.  Respiratory: Normal respiratory effort.   Gastrointestinal:  No distention.  Musculoskeletal: No gross deformities of extremities. Neurologic:  Normal speech and language. No gross focal neurologic deficits are appreciated.  Skin:  Skin is warm, dry and intact. No rash noted.  ____________________________________________  RADIOLOGY  None ____________________________________________   PROCEDURES  Procedure(s) performed:   Procedures  None ____________________________________________   INITIAL IMPRESSION / ASSESSMENT AND PLAN / ED COURSE  Pertinent labs &  imaging results that were available during my care of the patient were reviewed by me and considered in my medical decision making (see chart for details).   Patient presents with what appears to be MSK neck pain. No imaging at this time. No concern for infection. No sign of central nervous system process. Patient seems to be steering me toward prescribing her Percocet or other opiate. I do  not this this is warranted at this time. Provided Voltaren gel and Flexeril. Advised that Flexeril can cause drowsiness and she cannot drive while taking. Provided appropriate contact follow up information.     ____________________________________________  FINAL CLINICAL IMPRESSION(S) / ED DIAGNOSES  Final diagnoses:  Strain of neck muscle, initial encounter     NEW OUTPATIENT MEDICATIONS STARTED DURING THIS VISIT:  Discharge Medication List as of 03/27/2019  1:09 PM    START taking these medications   Details  cyclobenzaprine (FLEXERIL) 10 MG tablet Take 1 tablet (10 mg total) by mouth 3 (three) times daily as needed for muscle spasms., Starting Thu 03/27/2019, Normal    diclofenac sodium (VOLTAREN) 1 % GEL Apply 2 g topically 4 (four) times daily as needed., Starting Thu 03/27/2019, Normal        Note:  This document was prepared using Dragon voice recognition software and may include unintentional dictation errors.  Alona BeneJoshua Demmi Sindt, MD Emergency Medicine    Violetta Lavalle, Arlyss RepressJoshua G, MD 03/28/19 [phone]

## 2019-05-20 ENCOUNTER — Encounter: Payer: Self-pay | Admitting: Family Medicine

## 2019-05-20 ENCOUNTER — Ambulatory Visit: Payer: Medicaid Other | Admitting: Family Medicine

## 2019-05-20 ENCOUNTER — Other Ambulatory Visit: Payer: Self-pay

## 2019-05-20 VITALS — BP 118/72 | HR 78 | Temp 98.6°F | Resp 16 | Ht 64.0 in | Wt 218.0 lb

## 2019-05-20 DIAGNOSIS — Z7689 Persons encountering health services in other specified circumstances: Secondary | ICD-10-CM

## 2019-05-20 DIAGNOSIS — M67911 Unspecified disorder of synovium and tendon, right shoulder: Secondary | ICD-10-CM

## 2019-05-20 DIAGNOSIS — G8929 Other chronic pain: Secondary | ICD-10-CM

## 2019-05-20 DIAGNOSIS — M62838 Other muscle spasm: Secondary | ICD-10-CM | POA: Diagnosis not present

## 2019-05-20 DIAGNOSIS — M25511 Pain in right shoulder: Secondary | ICD-10-CM

## 2019-05-20 DIAGNOSIS — F3132 Bipolar disorder, current episode depressed, moderate: Secondary | ICD-10-CM

## 2019-05-20 DIAGNOSIS — M542 Cervicalgia: Secondary | ICD-10-CM

## 2019-05-20 MED ORDER — GABAPENTIN 100 MG PO CAPS: ORAL_CAPSULE | ORAL | 2 refills | Status: DC

## 2019-05-20 MED ORDER — NAPROXEN 500 MG PO TABS: 500.0000 mg | ORAL_TABLET | Freq: Two times a day (BID) | ORAL | 2 refills | Status: DC
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
## 2019-05-20 NOTE — Patient Instructions (Addendum)
Thank you for coming to the office today.  Referral to Psychiatry to help treat your Bipolar   Stay tuned for apt  [facility] ([place] [place] [facility]) Address: [address], [place], [place] [address] Hours: 8:30AM-5PM Phone: [phone]    Self Referral  [facility]) [address] [place], [id number] Phone: [phone] (Option 1) [email or web address]  [facility]) [place] [address] [place], [place] [address] Phone: [phone]  [employer], available walk-in 9am-4pm M-F [place], [place] [address] Hours: [place] (M-F, walk in available) [phone]   If thoughts of harming yourself or others, or any significant concern about your safety, please call for help immediately:  - [place] [phone] or [phone] - Suicide prevention hotline 639-245-2178  - 911  --------------  For chronic R shoulder pain, will refer to orthopedics  [facility] Address: [place], [place],  [address] Hours:  9AM-5PM Phone: [phone]  May be rotator cuff tendon injury  Recommend trial of Anti-inflammatory with Naproxen (Naprosyn) 500mg  tabs - take one with food and plenty of water TWICE daily every day (breakfast and dinner), for next 2-4 weeks, then you may take only as needed - DO NOT TAKE any ibuprofen, aleve, motrin while you are taking this medicine - It is safe to take Tylenol Ext Str 500mg  tabs - take 1 to 2 (max dose 1000mg ) every 6 hours as needed for breakthrough pain, max 24 hour daily dose is 6 to 8 tablets or 4000mg   Start Gabapentin 100mg  capsules, take at night for 2-3 nights only, and then increase to 2 times a day for a few days, and then may increase to 3 times a day, it may make you drowsy, if helps significantly at  night only, then you can increase instead to 3 capsules at night, instead of 3 times a day - In the future if needed, we can significantly increase the dose if tolerated well, some common doses are 300mg  three times a day up to 600mg  three times a day, usually it takes several weeks or months to get to higher doses    Please schedule a Follow-up Appointment to: Return in about 6 weeks (around 07/01/2019) for Bipolar/Depression/Anxiety Psych updates, Orthopedic shoulder pain.  If you have any other questions or concerns, please feel free to call the office or send a message through [place]. You may also schedule an earlier appointment if necessary.  Additionally, you may be receiving a survey about your experience at our office within a few days to 1 week by e-mail or mail. We value your feedback.  Nobie Putnam, DO [place]

## 2019-06-02 DIAGNOSIS — M542 Cervicalgia: Secondary | ICD-10-CM | POA: Diagnosis not present

## 2019-06-02 DIAGNOSIS — M7541 Impingement syndrome of right shoulder: Secondary | ICD-10-CM | POA: Diagnosis not present

## 2019-06-05 DIAGNOSIS — M25511 Pain in right shoulder: Secondary | ICD-10-CM | POA: Diagnosis not present

## 2019-06-05 DIAGNOSIS — M542 Cervicalgia: Secondary | ICD-10-CM | POA: Diagnosis not present

## 2019-06-26 ENCOUNTER — Other Ambulatory Visit: Payer: Self-pay

## 2019-06-26 ENCOUNTER — Ambulatory Visit: Payer: Medicaid Other | Admitting: Physician Assistant

## 2019-06-26 ENCOUNTER — Encounter: Payer: Self-pay | Admitting: Physician Assistant

## 2019-06-26 DIAGNOSIS — Z0389 Encounter for observation for other suspected diseases and conditions ruled out: Secondary | ICD-10-CM | POA: Diagnosis not present

## 2019-06-26 DIAGNOSIS — B9689 Other specified bacterial agents as the cause of diseases classified elsewhere: Secondary | ICD-10-CM

## 2019-06-26 DIAGNOSIS — N760 Acute vaginitis: Secondary | ICD-10-CM | POA: Diagnosis not present

## 2019-06-26 DIAGNOSIS — Z1388 Encounter for screening for disorder due to exposure to contaminants: Secondary | ICD-10-CM | POA: Diagnosis not present

## 2019-06-26 DIAGNOSIS — Z3009 Encounter for other general counseling and advice on contraception: Secondary | ICD-10-CM | POA: Diagnosis not present

## 2019-06-26 DIAGNOSIS — Z113 Encounter for screening for infections with a predominantly sexual mode of transmission: Secondary | ICD-10-CM | POA: Diagnosis not present

## 2019-06-26 LAB — WET PREP FOR TRICH, YEAST, CLUE
Trichomonas Exam: NEGATIVE
Yeast Exam: NEGATIVE

## 2019-06-26 MED ORDER — METRONIDAZOLE 500 MG PO TABS: 500.0000 mg | ORAL_TABLET | Freq: Two times a day (BID) | ORAL | 0 refills | Status: DC

## 2019-06-26 NOTE — Progress Notes (Signed)
STI clinic/screening visit  Subjective:  Mary Terry is a 28 y.o. female being seen today for an STI screening visit. The patient reports they do have symptoms.  Patient has the following medical conditions:   Patient Active Problem List   Diagnosis Date Noted  . Pregnant 04/06/2017  . Hx of preeclampsia, prior pregnancy, currently pregnant 04/06/2017  . Tobacco use disorder 03/10/2016  . Cannabis use disorder, mild, abuse 03/10/2016  . Suicidal ideation 03/09/2016  . Alcohol use disorder, mild, abuse 03/09/2016  . Major depressive disorder, recurrent, severe with psychotic features ([place]) 03/09/2016  . Mild cardiomegaly 07/13/2015  . Renal insufficiency 07/13/2015     Chief Complaint  Patient presents with  . SEXUALLY TRANSMITTED DISEASE    HPI  Patient reports that she has had an odor to her urine for about 1 month.  Had a long term partner and they are no longer together.  Wants to be sure everything is ok.  LMP 06/20/2019 and normal.     See flowsheet for further details and programmatic requirements.    The following portions of the patient's history were reviewed and updated as appropriate: allergies, current medications, past medical history, past social history, past surgical history and problem list.  Objective:  There were no vitals filed for this visit.  Physical Exam Constitutional:      General: She is not in acute distress.    Appearance: Normal appearance.  HENT:     Head: Normocephalic and atraumatic.     Mouth/Throat:     Mouth: Mucous membranes are moist.     Pharynx: Oropharynx is clear. No oropharyngeal exudate or posterior oropharyngeal erythema.  Eyes:     Conjunctiva/sclera: Conjunctivae normal.  Neck:     Musculoskeletal: Neck supple.  Pulmonary:     Effort: Pulmonary effort is normal.  Abdominal:     Palpations: Abdomen is soft. There is no mass.     Tenderness: There is no abdominal tenderness. There is no guarding or rebound.   Genitourinary:    General: Normal vulva.     Rectum: Normal.     Comments: External genitalia/pubic area without nits, lice, edema, erythema, lesions and inguinal adenopathy. Vagina with normal mucosa and moderate amount of menstrual bleeding. Uterus normal size, firm, mobile, nt, no masses, no CMT, no adnexal tenderness or fullness.  Lymphadenopathy:     Cervical: No cervical adenopathy.  Skin:    General: Skin is warm and dry.     Findings: No bruising, erythema, lesion or rash.  Neurological:     Mental Status: She is alert and oriented to person, place, and time.  Psychiatric:        Mood and Affect: Mood normal.        Thought Content: Thought content normal.        Judgment: Judgment normal.       Assessment and Plan:  DEJANA PUGSLEY is a 28 y.o. female presenting to the [facility] Department for STI screening  1. Screening for STD (sexually transmitted disease) Patient with symptoms. Await test results.  Counseled that RN will call if needs to RTC for further treatment once results are back. Rec condoms with all sex. - WET PREP FOR [place], YEAST, CLUE - Chlamydia/Gonorrhea [place] [facility] - HIV [place] [facility] - Syphilis Serology, [place] [facility]  2. BV (bacterial vaginosis) Will treat with Metronidazole 500mg  #14 1 po BID for 7 days with food, no EtOH for 24 hr  before and until 72 hr after completing medicine. No sex for 7 days Rec to use OTC antifungal cream if needed if itching starts during or after taking antibiotic. - metroNIDAZOLE (FLAGYL) 500 MG tablet; Take 1 tablet (500 mg total) by mouth 2 (two) times daily.  Dispense: 14 tablet; Refill: 0     No follow-ups on file.  Future Appointments  Date Time Provider Department Center  07/01/2019  9:20 AM Smitty CordsKaramalegos, Alexander J, DO [facility] None    [place] Holmesarla J [place], [place]

## 2019-07-01 ENCOUNTER — Ambulatory Visit: Payer: Medicaid Other | Admitting: Family Medicine

## 2019-07-11 ENCOUNTER — Telehealth: Payer: Self-pay

## 2019-07-11 NOTE — Telephone Encounter (Signed)
TC with patient. Verified ID via password. Informed of false positive RPR. Patient reports has had this happen before. Dicsussed HIV and GC/Chlamydia negative results.  Patient verbalized understanding Aileen Fass, RN

## 2019-08-08 ENCOUNTER — Telehealth: Payer: Self-pay

## 2019-08-08 DIAGNOSIS — A599 Trichomoniasis, unspecified: Secondary | ICD-10-CM | POA: Diagnosis not present

## 2019-08-08 DIAGNOSIS — R300 Dysuria: Secondary | ICD-10-CM | POA: Diagnosis not present

## 2019-08-08 NOTE — Telephone Encounter (Signed)
Per Kimberlee Nearing, patient called with questions about medication she was given at last appointment. TC to patient, identity verified. Patient asked what medication she was given at last STD appointment. Patient told she was given Metronidazole 500mg  1 tablet,  twice daily for 7 days for a bacterial infection. Patient denies any additional questions and states that is all the information she needs.Jenetta Downer, RN

## 2019-08-11 DIAGNOSIS — A549 Gonococcal infection, unspecified: Secondary | ICD-10-CM | POA: Diagnosis not present

## 2019-08-11 DIAGNOSIS — Z202 Contact with and (suspected) exposure to infections with a predominantly sexual mode of transmission: Secondary | ICD-10-CM | POA: Diagnosis not present

## 2019-11-05 DIAGNOSIS — F331 Major depressive disorder, recurrent, moderate: Secondary | ICD-10-CM | POA: Diagnosis not present

## 2019-11-07 NOTE — L&D Delivery Note (Signed)
Delivery Summary for Mary Terry  Labor Events:   Preterm labor: No data found  Rupture date: 07/27/2020  Rupture time: No data found  Rupture type: Spontaneous  Fluid Color: Clear  Induction: No data found  Augmentation: No data found  Complications: No data found  Cervical ripening: No data found No data found   No data found     Delivery:   Episiotomy: No data found  Lacerations: No data found  Repair suture: No data found  Repair # of packets: No data found  Blood loss (ml): 450   Information for the patient's newborn:  Mackinley, Cassaday [[id number]]    Delivery 07/28/2020 2:38 AM by  Vaginal, Spontaneous Sex:  female Gestational Age: [redacted]w[redacted]d Delivery Clinician:   Living?:         APGARS  One minute Five minutes Ten minutes  Skin color:        Heart rate:        Grimace:        Muscle tone:        Breathing:        Totals: 8  9      Presentation/position:      Resuscitation:   Cord information:    Disposition of cord blood:     Blood gases sent?  Complications:   Placenta: Delivered:       appearance Newborn Measurements: Weight: 5 lb 12.1 oz (2610 g)  Height: 18.5"  Head circumference:    Chest circumference:    Other providers:    Additional  information: Forceps:   Vacuum:   Breech:   Observed anomalies       Delivery Note At 2:38 AM a viable and healthy female was delivered via Vaginal, Spontaneous (Presentation: Left Occiput Anterior).  APGAR: 8, 9; weight 5 lb 12.1 oz (2610 g).   Placenta status: Spontaneous, Intact.  Cord: 3 vessels with the following complications: None.  Cord pH: not obtained.  Delayed cord clamping initially desired for 1 hour, however after 20 minutes was clamped per patient request.   Anesthesia: None Episiotomy: None Lacerations: None Suture Repair: None Est. Blood Loss (mL): 450  Mom to postpartum.  Baby to Couplet care / Skin to Skin.  Hildred Laser 07/29/2020, 12:43 PM

## 2019-11-20 DIAGNOSIS — F331 Major depressive disorder, recurrent, moderate: Secondary | ICD-10-CM | POA: Diagnosis not present

## 2020-01-27 DIAGNOSIS — N912 Amenorrhea, unspecified: Secondary | ICD-10-CM | POA: Diagnosis not present

## 2020-01-27 DIAGNOSIS — Z3682 Encounter for antenatal screening for nuchal translucency: Secondary | ICD-10-CM | POA: Diagnosis not present

## 2020-01-27 DIAGNOSIS — O26841 Uterine size-date discrepancy, first trimester: Secondary | ICD-10-CM | POA: Diagnosis not present

## 2020-01-27 DIAGNOSIS — Z3A10 10 weeks gestation of pregnancy: Secondary | ICD-10-CM | POA: Diagnosis not present

## 2020-01-27 DIAGNOSIS — Z8679 Personal history of other diseases of the circulatory system: Secondary | ICD-10-CM | POA: Diagnosis not present

## 2020-01-27 DIAGNOSIS — O3680X0 Pregnancy with inconclusive fetal viability, not applicable or unspecified: Secondary | ICD-10-CM | POA: Diagnosis not present

## 2020-01-27 DIAGNOSIS — Z3689 Encounter for other specified antenatal screening: Secondary | ICD-10-CM | POA: Diagnosis not present

## 2020-01-27 DIAGNOSIS — O99331 Smoking (tobacco) complicating pregnancy, first trimester: Secondary | ICD-10-CM | POA: Diagnosis not present

## 2020-01-27 DIAGNOSIS — Z3201 Encounter for pregnancy test, result positive: Secondary | ICD-10-CM | POA: Diagnosis not present

## 2020-01-27 DIAGNOSIS — O219 Vomiting of pregnancy, unspecified: Secondary | ICD-10-CM | POA: Diagnosis not present

## 2020-01-27 DIAGNOSIS — Z8759 Personal history of other complications of pregnancy, childbirth and the puerperium: Secondary | ICD-10-CM | POA: Diagnosis not present

## 2020-02-10 DIAGNOSIS — Z3481 Encounter for supervision of other normal pregnancy, first trimester: Secondary | ICD-10-CM | POA: Diagnosis not present

## 2020-02-10 DIAGNOSIS — A6000 Herpesviral infection of urogenital system, unspecified: Secondary | ICD-10-CM | POA: Diagnosis not present

## 2020-02-10 DIAGNOSIS — Z3682 Encounter for antenatal screening for nuchal translucency: Secondary | ICD-10-CM | POA: Diagnosis not present

## 2020-02-10 DIAGNOSIS — Z3A12 12 weeks gestation of pregnancy: Secondary | ICD-10-CM | POA: Diagnosis not present

## 2020-02-10 DIAGNOSIS — O98319 Other infections with a predominantly sexual mode of transmission complicating pregnancy, unspecified trimester: Secondary | ICD-10-CM | POA: Diagnosis not present

## 2020-02-19 DIAGNOSIS — R87610 Atypical squamous cells of undetermined significance on cytologic smear of cervix (ASC-US): Secondary | ICD-10-CM | POA: Diagnosis not present

## 2020-02-19 DIAGNOSIS — R87810 Cervical high risk human papillomavirus (HPV) DNA test positive: Secondary | ICD-10-CM | POA: Diagnosis not present

## 2020-03-09 DIAGNOSIS — Z3689 Encounter for other specified antenatal screening: Secondary | ICD-10-CM | POA: Diagnosis not present

## 2020-04-08 DIAGNOSIS — Z3689 Encounter for other specified antenatal screening: Secondary | ICD-10-CM | POA: Diagnosis not present

## 2020-04-08 DIAGNOSIS — Z3A21 21 weeks gestation of pregnancy: Secondary | ICD-10-CM | POA: Diagnosis not present

## 2020-04-08 DIAGNOSIS — Z3492 Encounter for supervision of normal pregnancy, unspecified, second trimester: Secondary | ICD-10-CM | POA: Diagnosis not present

## 2020-06-08 DIAGNOSIS — Z3689 Encounter for other specified antenatal screening: Secondary | ICD-10-CM | POA: Diagnosis not present

## 2020-06-08 DIAGNOSIS — Z23 Encounter for immunization: Secondary | ICD-10-CM | POA: Diagnosis not present

## 2020-06-24 DIAGNOSIS — Z3689 Encounter for other specified antenatal screening: Secondary | ICD-10-CM | POA: Diagnosis not present

## 2020-07-27 ENCOUNTER — Inpatient Hospital Stay
Admission: EM | Admit: 2020-07-27 | Discharge: 2020-07-29 | DRG: 807 | Disposition: A | Discharge status: Home / Self Care | Payer: Medicaid Other | Attending: Obstetrics and Gynecology | Admitting: Obstetrics and Gynecology

## 2020-07-27 ENCOUNTER — Other Ambulatory Visit: Payer: Self-pay

## 2020-07-27 DIAGNOSIS — O42013 Preterm premature rupture of membranes, onset of labor within 24 hours of rupture, third trimester: Secondary | ICD-10-CM | POA: Diagnosis not present

## 2020-07-27 DIAGNOSIS — O9081 Anemia of the puerperium: Secondary | ICD-10-CM | POA: Diagnosis not present

## 2020-07-27 DIAGNOSIS — O09299 Supervision of pregnancy with other poor reproductive or obstetric history, unspecified trimester: Secondary | ICD-10-CM | POA: Diagnosis not present

## 2020-07-27 DIAGNOSIS — O98313 Other infections with a predominantly sexual mode of transmission complicating pregnancy, third trimester: Secondary | ICD-10-CM | POA: Diagnosis not present

## 2020-07-27 DIAGNOSIS — F17210 Nicotine dependence, cigarettes, uncomplicated: Secondary | ICD-10-CM | POA: Diagnosis present

## 2020-07-27 DIAGNOSIS — A6009 Herpesviral infection of other urogenital tract: Secondary | ICD-10-CM | POA: Diagnosis not present

## 2020-07-27 DIAGNOSIS — Z23 Encounter for immunization: Secondary | ICD-10-CM | POA: Diagnosis not present

## 2020-07-27 DIAGNOSIS — O165 Unspecified maternal hypertension, complicating the puerperium: Secondary | ICD-10-CM | POA: Diagnosis present

## 2020-07-27 DIAGNOSIS — A6000 Herpesviral infection of urogenital system, unspecified: Secondary | ICD-10-CM | POA: Diagnosis not present

## 2020-07-27 DIAGNOSIS — Z3A36 36 weeks gestation of pregnancy: Secondary | ICD-10-CM

## 2020-07-27 DIAGNOSIS — O99334 Smoking (tobacco) complicating childbirth: Secondary | ICD-10-CM | POA: Diagnosis present

## 2020-07-27 DIAGNOSIS — Z20822 Contact with and (suspected) exposure to covid-19: Secondary | ICD-10-CM | POA: Diagnosis present

## 2020-07-27 DIAGNOSIS — O98319 Other infections with a predominantly sexual mode of transmission complicating pregnancy, unspecified trimester: Secondary | ICD-10-CM | POA: Diagnosis not present

## 2020-07-27 DIAGNOSIS — Z8759 Personal history of other complications of pregnancy, childbirth and the puerperium: Secondary | ICD-10-CM

## 2020-07-27 LAB — RAPID HIV SCREEN (HIV 1/2 AB+AG)
HIV 1/2 Antibodies: NONREACTIVE
HIV-1 P24 Antigen - HIV24: NONREACTIVE

## 2020-07-27 LAB — CHLAMYDIA/NGC RT PCR (ARMC ONLY)
Chlamydia Tr: NOT DETECTED
N gonorrhoeae: NOT DETECTED

## 2020-07-27 LAB — CBC WITH DIFFERENTIAL/PLATELET
Abs Immature Granulocytes: 0.12 10*3/uL — ABNORMAL HIGH (ref 0.00–0.07)
Basophils Absolute: 0 10*3/uL (ref 0.0–0.1)
Basophils Relative: 0 %
Eosinophils Absolute: 0.1 10*3/uL (ref 0.0–0.5)
Eosinophils Relative: 1 %
HCT: 32.9 % — ABNORMAL LOW (ref 36.0–46.0)
Hemoglobin: 10.8 g/dL — ABNORMAL LOW (ref 12.0–15.0)
Immature Granulocytes: 1 %
Lymphocytes Relative: 15 %
Lymphs Abs: 2.3 10*3/uL (ref 0.7–4.0)
MCH: 27.1 pg (ref 26.0–34.0)
MCHC: 32.8 g/dL (ref 30.0–36.0)
MCV: 82.5 fL (ref 80.0–100.0)
Monocytes Absolute: 1.1 10*3/uL — ABNORMAL HIGH (ref 0.1–1.0)
Monocytes Relative: 7 %
Neutro Abs: 11.8 10*3/uL — ABNORMAL HIGH (ref 1.7–7.7)
Neutrophils Relative %: 76 %
Platelets: 350 10*3/uL (ref 150–400)
RBC: 3.99 MIL/uL (ref 3.87–5.11)
RDW: 14.3 % (ref 11.5–15.5)
WBC: 15.4 10*3/uL — ABNORMAL HIGH (ref 4.0–10.5)
nRBC: 0 % (ref 0.0–0.2)

## 2020-07-27 LAB — TYPE AND SCREEN
ABO/RH(D): O NEG
Antibody Screen: NEGATIVE
DAT, IgG: NEGATIVE
Weak D: POSITIVE

## 2020-07-27 LAB — GROUP B STREP BY PCR: Group B strep by PCR: NEGATIVE

## 2020-07-27 LAB — SARS CORONAVIRUS 2 BY RT PCR (HOSPITAL ORDER, PERFORMED IN ~~LOC~~ HOSPITAL LAB): SARS Coronavirus 2: NEGATIVE

## 2020-07-27 MED ORDER — OXYTOCIN-SODIUM CHLORIDE 30-0.9 UT/500ML-% IV SOLN: 2.5000 [IU]/h | INTRAVENOUS | Status: DC

## 2020-07-27 MED ORDER — OXYTOCIN BOLUS FROM INFUSION
333.0000 mL | Freq: Once | INTRAVENOUS | Status: AC
  Administered 2020-07-28: 333 mL via INTRAVENOUS

## 2020-07-27 MED ORDER — AMMONIA AROMATIC IN INHA
RESPIRATORY_TRACT | Status: AC
  Filled 2020-07-27: qty 10

## 2020-07-27 MED ORDER — SODIUM CHLORIDE 0.9 % IV SOLN
2.0000 g | Freq: Once | INTRAVENOUS | Status: AC
  Administered 2020-07-27: 2 g via INTRAVENOUS
  Filled 2020-07-27: qty 2000

## 2020-07-27 MED ORDER — ACETAMINOPHEN 325 MG PO TABS: 650.0000 mg | ORAL_TABLET | ORAL | Status: DC | PRN

## 2020-07-27 MED ORDER — LACTATED RINGERS IV SOLN: 500.0000 mL | INTRAVENOUS | Status: DC | PRN

## 2020-07-27 MED ORDER — TERBUTALINE SULFATE 1 MG/ML IJ SOLN: 0.2500 mg | Freq: Once | INTRAMUSCULAR | Status: DC | PRN

## 2020-07-27 MED ORDER — SOD CITRATE-CITRIC ACID 500-334 MG/5ML PO SOLN: 30.0000 mL | ORAL | Status: DC | PRN

## 2020-07-27 MED ORDER — LIDOCAINE HCL (PF) 1 % IJ SOLN: 30.0000 mL | INTRAMUSCULAR | Status: DC | PRN

## 2020-07-27 MED ORDER — LIDOCAINE HCL (PF) 1 % IJ SOLN
INTRAMUSCULAR | Status: AC
  Filled 2020-07-27: qty 30

## 2020-07-27 MED ORDER — LACTATED RINGERS IV SOLN
125.0000 mL/h | INTRAVENOUS | Status: DC
  Administered 2020-07-27 – 2020-07-28 (×2): 125 mL/h via INTRAVENOUS

## 2020-07-27 MED ORDER — ONDANSETRON HCL 4 MG/2ML IJ SOLN: 4.0000 mg | Freq: Four times a day (QID) | INTRAMUSCULAR | Status: DC | PRN

## 2020-07-27 MED ORDER — OXYTOCIN 10 UNIT/ML IJ SOLN
INTRAMUSCULAR | Status: AC
  Filled 2020-07-27: qty 2

## 2020-07-27 MED ORDER — SODIUM CHLORIDE 0.9 % IV SOLN: 1.0000 g | INTRAVENOUS | Status: DC

## 2020-07-27 MED ORDER — MISOPROSTOL 200 MCG PO TABS
ORAL_TABLET | ORAL | Status: AC
  Filled 2020-07-27: qty 4

## 2020-07-27 MED ORDER — OXYTOCIN-SODIUM CHLORIDE 30-0.9 UT/500ML-% IV SOLN
1.0000 m[IU]/min | INTRAVENOUS | Status: DC
  Administered 2020-07-28: 4 m[IU]/min via INTRAVENOUS
  Filled 2020-07-27: qty 500

## 2020-07-27 MED ORDER — MISOPROSTOL 50MCG HALF TABLET
50.0000 ug | ORAL_TABLET | ORAL | Status: DC | PRN
  Administered 2020-07-27: 50 ug via ORAL
  Filled 2020-07-27: qty 1

## 2020-07-27 MED ORDER — LACTATED RINGERS IV BOLUS: 1000.0000 mL | Freq: Once | INTRAVENOUS | Status: DC

## 2020-07-27 MED ORDER — BUTORPHANOL TARTRATE 1 MG/ML IJ SOLN
1.0000 mg | INTRAMUSCULAR | Status: DC | PRN
  Administered 2020-07-27: 1 mg via INTRAVENOUS
  Filled 2020-07-27: qty 1
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
## 2020-07-27 NOTE — Progress Notes (Signed)
Intrapartum Progress Note  S: patient ambulating in hallways, feeling contractions.   O: Blood pressure (!) 145/77, pulse 70, temperature 99 F (37.2 C), temperature source Oral, resp. rate 18, height 5\' 4"  (1.626 m), weight 95.7 kg, last menstrual period 11/12/2019, unknown if currently breastfeeding. Gen App: NAD, comfortable Abdomen: soft, gravid FHT: baseline 120 bpm.  Accels present.  Decels absent. moderate in degree variability.   Tocometer: difficult to detect on tocometer but appear to be 3 min apart.  Cervix: will perform next check around midnight.  Extremities: Nontender, no edema.  Pitocin: None  Labs:  No new labs   Assessment:  1: SIUP at [redacted]w[redacted]d 2. PPROM 3. H/o pre-eclampsia   Plan:  1. Continue IOl with Cytotec. Next dose due at midnight.  However if contracting regularly or good cervical change noted, can proceed with Pitocin as needed.  2. GBS negative, no need for further antibiotics.  3. Analgesia as needed.  4. Elevated BPs noted, however patient noting pain with contractions. If still elevated after receiving analgesia, will order PIH evaluation.    [redacted]w[redacted]d, MD 07/27/2020 11:28 PM

## 2020-07-28 ENCOUNTER — Encounter: Payer: Self-pay | Admitting: Obstetrics and Gynecology

## 2020-07-28 DIAGNOSIS — Z3A36 36 weeks gestation of pregnancy: Secondary | ICD-10-CM

## 2020-07-28 DIAGNOSIS — O09299 Supervision of pregnancy with other poor reproductive or obstetric history, unspecified trimester: Secondary | ICD-10-CM

## 2020-07-28 DIAGNOSIS — O42013 Preterm premature rupture of membranes, onset of labor within 24 hours of rupture, third trimester: Principal | ICD-10-CM

## 2020-07-28 DIAGNOSIS — O98313 Other infections with a predominantly sexual mode of transmission complicating pregnancy, third trimester: Secondary | ICD-10-CM

## 2020-07-28 DIAGNOSIS — A6009 Herpesviral infection of other urogenital tract: Secondary | ICD-10-CM

## 2020-07-28 LAB — RPR
RPR Ser Ql: REACTIVE — AB
RPR Titer: 1:2 {titer}

## 2020-07-28 LAB — COMPREHENSIVE METABOLIC PANEL
ALT: 13 U/L (ref 0–44)
AST: 23 U/L (ref 15–41)
Albumin: 2.8 g/dL — ABNORMAL LOW (ref 3.5–5.0)
Alkaline Phosphatase: 180 U/L — ABNORMAL HIGH (ref 38–126)
Anion gap: 10 (ref 5–15)
BUN: <5 mg/dL — ABNORMAL LOW (ref 6–20)
CO2: 22 mmol/L (ref 22–32)
Calcium: 8.4 mg/dL — ABNORMAL LOW (ref 8.9–10.3)
Chloride: 108 mmol/L (ref 98–111)
Creatinine, Ser: 0.61 mg/dL (ref 0.44–1.00)
GFR calc Af Amer: >60 mL/min (ref >60)
GFR calc non Af Amer: >60 mL/min (ref >60)
Glucose, Bld: 97 mg/dL (ref 70–99)
Potassium: 4.2 mmol/L (ref 3.5–5.1)
Sodium: 140 mmol/L (ref 135–145)
Total Bilirubin: 0.5 mg/dL (ref 0.3–1.2)
Total Protein: 6.2 g/dL — ABNORMAL LOW (ref 6.5–8.1)

## 2020-07-28 LAB — ABO/RH
ABO/RH(D): O NEG
DAT, IgG: NEGATIVE
Weak D: POSITIVE

## 2020-07-28 LAB — PROTEIN / CREATININE RATIO, URINE
Creatinine, Urine: 82 mg/dL
Protein Creatinine Ratio: 0.15 mg/mg{Cre} (ref 0.00–0.15)
Total Protein, Urine: 12 mg/dL

## 2020-07-28 MED ORDER — DIPHENHYDRAMINE HCL 25 MG PO CAPS: 25.0000 mg | ORAL_CAPSULE | Freq: Four times a day (QID) | ORAL | Status: DC | PRN

## 2020-07-28 MED ORDER — ONDANSETRON HCL 4 MG PO TABS
4.0000 mg | ORAL_TABLET | ORAL | Status: DC | PRN
  Filled 2020-07-28: qty 1

## 2020-07-28 MED ORDER — ACETAMINOPHEN 325 MG PO TABS
650.0000 mg | ORAL_TABLET | ORAL | Status: DC | PRN
  Administered 2020-07-28 – 2020-07-29 (×3): 650 mg via ORAL
  Filled 2020-07-28 (×3): qty 2

## 2020-07-28 MED ORDER — PRENATAL MULTIVITAMIN CH
1.0000 | ORAL_TABLET | Freq: Every day | ORAL | Status: DC
  Administered 2020-07-28 – 2020-07-29 (×2): 1 via ORAL
  Filled 2020-07-28 (×2): qty 1

## 2020-07-28 MED ORDER — SIMETHICONE 80 MG PO CHEW: 80.0000 mg | CHEWABLE_TABLET | ORAL | Status: DC | PRN

## 2020-07-28 MED ORDER — WITCH HAZEL-GLYCERIN EX PADS: 1.0000 "application " | MEDICATED_PAD | CUTANEOUS | Status: DC | PRN

## 2020-07-28 MED ORDER — COCONUT OIL OIL: 1.0000 "application " | TOPICAL_OIL | Status: DC | PRN

## 2020-07-28 MED ORDER — DIBUCAINE (PERIANAL) 1 % EX OINT: 1.0000 "application " | TOPICAL_OINTMENT | CUTANEOUS | Status: DC | PRN

## 2020-07-28 MED ORDER — ZOLPIDEM TARTRATE 5 MG PO TABS: 5.0000 mg | ORAL_TABLET | Freq: Every evening | ORAL | Status: DC | PRN

## 2020-07-28 MED ORDER — ACETAMINOPHEN 325 MG PO TABS
ORAL_TABLET | ORAL | Status: AC
  Administered 2020-07-28: 650 mg via ORAL
  Filled 2020-07-28: qty 2

## 2020-07-28 MED ORDER — SODIUM CHLORIDE 0.9% FLUSH: 10.0000 mL | INTRAVENOUS | Status: DC | PRN

## 2020-07-28 MED ORDER — BENZOCAINE-MENTHOL 20-0.5 % EX AERO: 1.0000 "application " | INHALATION_SPRAY | CUTANEOUS | Status: DC | PRN

## 2020-07-28 MED ORDER — INFLUENZA VAC SPLIT QUAD 0.5 ML IM SUSY
0.5000 mL | PREFILLED_SYRINGE | INTRAMUSCULAR | Status: AC
  Administered 2020-07-29: 0.5 mL via INTRAMUSCULAR
  Filled 2020-07-28: qty 0.5

## 2020-07-28 MED ORDER — ONDANSETRON HCL 4 MG/2ML IJ SOLN: 4.0000 mg | INTRAMUSCULAR | Status: DC | PRN

## 2020-07-28 MED ORDER — IBUPROFEN 600 MG PO TABS
ORAL_TABLET | ORAL | Status: AC
  Administered 2020-07-28: 600 mg via ORAL
  Filled 2020-07-28: qty 1

## 2020-07-28 MED ORDER — SENNOSIDES-DOCUSATE SODIUM 8.6-50 MG PO TABS
2.0000 | ORAL_TABLET | ORAL | Status: DC
  Administered 2020-07-29: 2 via ORAL
  Filled 2020-07-28: qty 2

## 2020-07-28 MED ORDER — IBUPROFEN 600 MG PO TABS
600.0000 mg | ORAL_TABLET | Freq: Four times a day (QID) | ORAL | Status: DC
  Administered 2020-07-28 – 2020-07-29 (×5): 600 mg via ORAL
  Filled 2020-07-28 (×5): qty 1

## 2020-07-28 NOTE — Lactation Note (Signed)
This note was copied from a baby's chart. Lactation Consultation Note  [name] [id number] Date: 07/28/2020 Reason for consult: Initial assessment;Early term 37-38.6wks;Infant < 6lbs  Initial lactation visit. Mom is G6P5, SVD 8hrs ago. Mom reports varied breastfeeding experience with other children- 2 months to 8 months, and wants to BF as long as possible with this baby. No pain/discomfort reported with feedings at this time, baby has had a wet diaper since delivery.  LC reviewed breastfeeding basics, newborn feeding patterns, early hunger cues, output expectations, normal course of lactation, milk supply and demand, and tips for acceptance of both breasts and keeping baby awake at the breast. Educated mom on feeding behaviors of a 37 weeker, encouraged skin to skin as much as possible, and attempts frequently at offering the breast.  Lactation name/number written on whiteboard, mom plans to call for assistance at next feeding.  Maternal Data Formula Feeding for Exclusion: No Has patient been taught Hand Expression?: Yes Does the patient have breastfeeding experience prior to this delivery?: Yes  Feeding    LATCH Score                   Interventions Interventions: Breast feeding basics reviewed  Lactation Tools Discussed/Used     Consult Status Consult Status: Follow-up Date: 07/28/20 Follow-up type: In-patient    Mary Terry 07/28/2020, 10:46 AM

## 2020-07-28 NOTE — Plan of Care (Signed)
Alert and oriented with aprop. Affect. VS and Assessment WNL. Denies c/o. Appears comfortable and in NAD.

## 2020-07-28 NOTE — Clinical Social Work Maternal (Signed)
CLINICAL SOCIAL WORK MATERNAL/CHILD NOTE  Patient Details  Name: Mary Terry MRN: [id number] Date of Birth: 11/22/1990  Date:  07/28/2020  Clinical Social Worker Initiating Note:  Veronica Fretz Date/Time: Initiated:  07/28/20/      Child's Name:  Kenan Claude O'Neil Spragg   Biological Parents:  Father, Mother   Need for Interpreter:  None   Reason for Referral:  Other (Comment) ([place] Score: 10.)   Address:  [address] [place] [address]    Phone number:  [phone] (home)     Additional phone number: None.  Household Members/Support Persons (HM/SP):   Household Member/Support Person 1, Household Member/Support Person 2, Household Member/Support Person 3, Household Member/Support Person 4   HM/SP Name Relationship DOB or Age  HM/SP -1 Zoey daughter 10 years  HM/SP -2 Kyrie son 6 years  HM/SP -3 Kahlia daughter 5 years  HM/SP -4 Kaleb son 2 years  HM/SP -5        HM/SP -6        HM/SP -7        HM/SP -8          Natural Supports (not living in the home):  Immediate Family, Friends, Extended Family   Professional Supports:     Employment: Unemployed   Type of Work:     Education:  Some College   Homebound arranged:    Financial Resources:  Medicaid   Other Resources:  Food Stamps    Cultural/Religious Considerations Which May Impact Care:  None.  Strengths:  Ability to meet basic needs , Compliance with medical plan , Pediatrician chosen, Home prepared for child , Understanding of illness   Psychotropic Medications:         Pediatrician:    [place]  Pediatrician List:   [place]    [place]    [place]    [place] Other ([facility])  [place]    [place]      Pediatrician Fax Number:    Risk Factors/Current Problems:  None   Cognitive State:  Alert , Able to Concentrate , Goal Oriented    Mood/Affect:  Calm , Happy    CSW Assessment:    CSW received a consult for MOB's Edinburgh Score.  CSW spoke with RN Kendra prior to meeting with MOB. Per RN, MOB is appropriate and there are no additional concerns at this time.   CSW met with MOB at bedside. Explained CSW's role and reason for referral.  MOB reported she is feeling good post delivery. MOB was alert, appropriate, and attentive to Baby during assessment.   Confirmed contact information for MOB. MOB and Baby will be living with her other children (listed above) at discharge.   MOB reported she receives Food Stamps and will inform her Worker of Baby's birth. MOB plans to use [facility] for Baby's Medical Care. MOB reported she has a crib, bassinett, pack and play, car seat (used- CSW reminded her to check that it is not expired), clothing, diapers, and all other items needed for Baby. MOB reported she has reliable transportation for herself and Baby. MOB denied resource needs at this time.   Per chart review, MOB has a history of depression and Bipolar Disorder. MOB confirmed this and reported she did take medications in the past, but did not like how they made her feel, so decided not to take medications. MOB reported she saw several different counselors in the past,   is not currently involved in therapy. MOB reported she has good coping skills that are effective. MOB reported she has a good support system and is coping well emotionally at this time. MOB denied current or recent SI, HI, or DV. MOB denied the need for mental health support resources at this time, reported she is aware of resources if needed.  CSW provided education and information sheets on PPD and SIDS. MOB verbalized understanding. CSW ecouraged MOB to reach out to her Provider with any questions or needs for support or resources, even after discharge.   MOB denied any needs or questions at this time. CSW encouraged MOB to reach out if any arise prior to discharge.   Please re consult CSW if  any additional needs or concerns arise.  CSW Plan/Description:  No Further Intervention Required/No Barriers to Discharge, Sudden Infant Death Syndrome (SIDS) Education, Perinatal Mood and Anxiety Disorder (PMADs) Education, Other Patient/Family Education    Mary Lantier E Jash Wahlen, LCSW 07/28/2020, 1:08 PM  

## 2020-07-29 LAB — CBC
HCT: 28.8 % — ABNORMAL LOW (ref 36.0–46.0)
Hemoglobin: 9.3 g/dL — ABNORMAL LOW (ref 12.0–15.0)
MCH: 27 pg (ref 26.0–34.0)
MCHC: 32.3 g/dL (ref 30.0–36.0)
MCV: 83.7 fL (ref 80.0–100.0)
Platelets: 298 10*3/uL (ref 150–400)
RBC: 3.44 MIL/uL — ABNORMAL LOW (ref 3.87–5.11)
RDW: 14.4 % (ref 11.5–15.5)
WBC: 13.4 10*3/uL — ABNORMAL HIGH (ref 4.0–10.5)
nRBC: 0 % (ref 0.0–0.2)

## 2020-07-29 LAB — T.PALLIDUM AB, TOTAL: T Pallidum Abs: NONREACTIVE

## 2020-07-29 MED ORDER — DOCUSATE SODIUM 100 MG PO CAPS: 100.0000 mg | ORAL_CAPSULE | Freq: Every day | ORAL | 1 refills | Status: DC | PRN

## 2020-07-29 MED ORDER — LABETALOL HCL 100 MG PO TABS: 100.0000 mg | ORAL_TABLET | Freq: Two times a day (BID) | ORAL | 1 refills | Status: DC

## 2020-07-29 MED ORDER — FERROUS SULFATE 325 (65 FE) MG PO TABS: 325.0000 mg | ORAL_TABLET | Freq: Every day | ORAL | 1 refills | Status: AC

## 2020-07-29 MED ORDER — IBUPROFEN 600 MG PO TABS
600.0000 mg | ORAL_TABLET | Freq: Four times a day (QID) | ORAL | 1 refills | Status: DC | PRN
Start: 2020-07-29 — End: 2022-06-16

## 2020-07-29 NOTE — Progress Notes (Signed)
Patient discharged home with infant. Discharge instructions and prescriptions given and reviewed with patient. Patient to make BP follow up 3-5 days with her OB. Patient wants to make herself to work with her schedule. Patient verbalized understanding. Escorted out by auxillary.

## 2020-07-29 NOTE — Progress Notes (Signed)
Post Partum Day # 1, s/p SVD  Subjective: no complaints, up ad lib, voiding and tolerating PO  Objective: Temp:  [98 F (36.7 C)-98.8 F (37.1 C)] 98.2 F (36.8 C) (09/23 1141) Pulse Rate:  [69-89] 69 (09/23 1141) Resp:  [16-20] 20 (09/23 1141) BP: (116-146)/(77-99) 146/85 (09/23 1141) SpO2:  [98 %-100 %] 100 % (09/23 0811)  Physical Exam:  General: alert and no distress  Lungs: clear to auscultation bilaterally Breasts: normal appearance, no masses or tenderness Heart: regular rate and rhythm, S1, S2 normal, no murmur, click, rub or gallop Abdomen: soft, non-tender; bowel sounds normal; no masses,  no organomegaly Pelvis: Lochia: appropriate, Uterine Fundus: firm Extremities: DVT Evaluation: No evidence of DVT seen on physical exam. Negative Homan's sign. No cords or calf tenderness. No significant calf/ankle edema.    Recent Labs    07/27/20 1736 07/29/20 0633  HGB 10.8* 9.3*  HCT 32.9* 28.8*    Assessment/Plan: Doing well postpartum Breastfeeding, Lactation consult as needed Circumcision performed yesterday.  Contraception: undecided.  Initially desired BTL however now is unsure. Notes that if she still desires BTL after discharge she will notify her OB/GYN. Otherwise, can discuss other options at her postpartum visit.  BPs labile. In setting of prior history of pre-eclampsia, will likely d/c home with meds.  Mild anemia postpartum, will prescribe daily iron.  Discharge home today.     LOS: 2 days   Hildred Laser, MD [facility] 07/29/2020 12:46 PM

## 2020-07-29 NOTE — Lactation Note (Signed)
This note was copied from a baby's chart. Lactation Consultation Note  [name] [id number] Date: 07/29/2020 Reason for consult: Follow-up assessment;Early term 37-38.6wks;Infant < 6lbs  Lactation follow-up before discharge. Baby continues to feed well, void/stooling above expectations, although bili is high. Lab did a draw, results pending. Reviewed feeding expectations for 37wk baby, encouraged to keep baby awake and stimulated while at the breast to ensure adequate transfer.  Encouraged to feed with early cues, discussed potential cluster feeding and growth spurt periods. Educated on breast fullness and engorgement and management of both, and when to seek care from MD. Information for outpatient lactation services and community breastfeeding resources given.  Maternal Data Has patient been taught Hand Expression?: Yes Does the patient have breastfeeding experience prior to this delivery?: Yes  Feeding Feeding Type: Breast Fed  LATCH Score                   Interventions Interventions: Breast feeding basics reviewed  Lactation Tools Discussed/Used     Consult Status Consult Status: Complete    Danford Bad 07/29/2020, 10:52 AM

## 2020-07-29 NOTE — Discharge Summary (Signed)
   Postpartum Discharge Summary     [name] DOB: 06/26/1991 MRN: [id number]  Date of admission: 07/27/2020 Delivery date:07/28/2020  Delivering provider: ,   Date of discharge: 07/29/2020  Admitting diagnosis: Preterm premature rupture of membranes (PPROM) with onset of labor within 24 hours of rupture in third trimester, antepartum [O42.013] Intrauterine pregnancy: [redacted]w[redacted]d     Secondary diagnosis:  Active Problems:   Hx of preeclampsia, prior pregnancy, currently pregnant   Preterm premature rupture of membranes (PPROM) with onset of labor within 24 hours of rupture in third trimester, antepartum   Genital herpes complicating pregnancy  Additional problems: Anemia of pregnancy   Discharge diagnosis: Term Pregnancy Delivered, Anemia and Postpartum hypertension                                              Post partum procedures:None Augmentation: Pitocin and Cytotec Complications: None  Hospital course: Induction of Labor With Vaginal Delivery   29 y.o. yo G6P5015 at [redacted]w[redacted]d was admitted to the hospital 07/27/2020 for induction of labor.  Indication for induction: PPROM.  Patient had an uncomplicated labor course as follows: Membrane Rupture Time/Date: 11:45 AM ,07/27/2020   Delivery Method:Vaginal, Spontaneous  Episiotomy: None  Lacerations:  None  Details of delivery can be found in separate delivery note.  Patient had a routine postpartum course. Patient is discharged home 07/29/20.  Newborn Data: Birth date:07/28/2020  Birth time:2:38 AM  Gender:Female  Living status:Living  Apgars:8 ,9  Weight:2610 g   Magnesium Sulfate received: No BMZ received: No Rhophylac:No MMR:No T-DaP:Given prenatally Flu: Yes  (given during hospitalization) Transfusion:No  Physical exam  Vitals:   07/29/20 0013 07/29/20 0300 07/29/20 0811 07/29/20 1141  BP: 125/81 116/77 129/88 (!) 146/85  Pulse: 82 69 72 69  Resp:  16 20 20  Temp:  98 F (36.7 C) 98.7 F  (37.1 C) 98.2 F (36.8 C)  TempSrc:  Oral Oral Oral  SpO2: 100% 100% 100%   Weight:      Height:       General: alert and no distress Lochia: appropriate Uterine Fundus: firm Incision: N/A DVT Evaluation: No evidence of DVT seen on physical exam. Negative Homan's sign. No cords or calf tenderness. No significant calf/ankle edema. Labs: Lab Results  Component Value Date   WBC 13.4 (H) 07/29/2020   HGB 9.3 (L) 07/29/2020   HCT 28.8 (L) 07/29/2020   MCV 83.7 07/29/2020   PLT 298 07/29/2020   CMP Latest Ref Rng & Units 07/27/2020  Glucose 70 - 99 mg/dL 97  BUN 6 - 20 mg/dL <5(L)  Creatinine 0.44 - 1.00 mg/dL 0.61  Sodium 135 - 145 mmol/L 140  Potassium 3.5 - 5.1 mmol/L 4.2  Chloride 98 - 111 mmol/L 108  CO2 22 - 32 mmol/L 22  Calcium 8.9 - 10.3 mg/dL 8.4(L)  Total Protein 6.5 - 8.1 g/dL 6.2(L)  Total Bilirubin 0.3 - 1.2 mg/dL 0.5  Alkaline Phos 38 - 126 U/L 180(H)  AST 15 - 41 U/L 23  ALT 0 - 44 U/L 13   Edinburgh Score: Edinburgh Postnatal Depression Scale Screening Tool 07/28/2020  I have been able to laugh and see the funny side of things. 0  I have looked forward with enjoyment to things. 0  I have blamed myself unnecessarily when things went wrong. 2  I have been anxious   or worried for no good reason. 2  I have felt scared or panicky for no good reason. 2  Things have been getting on top of me. 1  I have been so unhappy that I have had difficulty sleeping. 1  I have felt sad or miserable. 1  I have been so unhappy that I have been crying. 1  The thought of harming myself has occurred to me. 0  Edinburgh Postnatal Depression Scale Total 10      After visit meds:  Allergies as of 07/29/2020      Reactions   Percocet [oxycodone-acetaminophen] Itching   Patient denies this allergy 03/27/19      Medication List    TAKE these medications   docusate sodium 100 MG capsule Commonly known as: COLACE Take 1 capsule (100 mg total) by mouth daily as needed for  mild constipation.   ferrous sulfate 325 (65 FE) MG tablet Commonly known as: FerrouSul Take 1 tablet (325 mg total) by mouth daily with breakfast.   ibuprofen 600 MG tablet Commonly known as: ADVIL Take 1 tablet (600 mg total) by mouth every 6 (six) hours as needed.   labetalol 100 MG tablet Commonly known as: NORMODYNE Take 1 tablet (100 mg total) by mouth 2 (two) times daily.        Discharge home in stable condition Infant Feeding: Breast Infant Disposition:home with mother Discharge instruction: per After Visit Summary and Postpartum booklet. Activity: Advance as tolerated. Pelvic rest for 6 weeks.  Diet: routine diet Anticipated Birth Control: Unsure Postpartum Appointment:6 weeks Additional Postpartum F/U: BP check 3-5 days/ postpartum mood check Future Appointments:No future appointments. Follow up Visit:  [facility], [facility]. Schedule an appointment as soon as possible for a visit.   Why: 3-5 days for blood pressure check 6 weeks for postpartum visit Contact information: [address] [place] [address] [phone]                   07/29/2020 Rubie Maid, MD

## 2020-07-30 ENCOUNTER — Telehealth: Payer: Self-pay

## 2020-07-30 NOTE — Telephone Encounter (Signed)
Transition Care Management Follow-up Telephone Call  Date of discharge and from where: 07/29/2020 from [facility]  How have you been since you were released from the hospital? Patient is feeling well and baby is also doing very well.   Any questions or concerns? No  Items Reviewed:  Did the pt receive and understand the discharge instructions provided? Yes   Medications obtained and verified? Yes   Any new allergies since your discharge? No   Dietary orders reviewed? YES  Do you have support at home? Yes   Functional Questionnaire: (I = Independent and D = Dependent) ADLs: I  Bathing/Dressing- I  Meal Prep- I  Eating- I  Maintaining continence- I  Transferring/Ambulation- I  Managing Meds- I  Follow up appointments reviewed:   PCP Hospital f/u appt confirmed? Patient will schedule once she has adjusted and feels comfortable going out.   Are transportation arrangements needed? No   If their condition worsens, is the pt aware to call PCP or go to the Emergency Dept.? Yes  Was the patient provided with contact information for the PCP's office or ED? Yes  Was to pt encouraged to call back with questions or concerns? Yes

## 2020-09-08 ENCOUNTER — Encounter: Payer: Medicaid Other | Admitting: Obstetrics and Gynecology

## 2020-09-08 DIAGNOSIS — Z3A37 37 weeks gestation of pregnancy: Secondary | ICD-10-CM | POA: Diagnosis not present

## 2020-09-13 ENCOUNTER — Encounter: Payer: Self-pay | Admitting: Obstetrics and Gynecology

## 2020-12-13 ENCOUNTER — Telehealth (HOSPITAL_COMMUNITY): Payer: Self-pay | Admitting: Clinical

## 2020-12-13 ENCOUNTER — Ambulatory Visit (HOSPITAL_COMMUNITY): Payer: Medicaid Other | Admitting: Clinical

## 2020-12-13 ENCOUNTER — Other Ambulatory Visit: Payer: Self-pay

## 2020-12-13 NOTE — Telephone Encounter (Signed)
The patient did not respond to video link or phone call no VM has been set up for this number.

## 2020-12-14 ENCOUNTER — Telehealth (HOSPITAL_COMMUNITY): Payer: Self-pay | Admitting: Clinical

## 2020-12-14 ENCOUNTER — Ambulatory Visit (HOSPITAL_COMMUNITY): Payer: Medicaid Other | Admitting: Clinical

## 2020-12-14 ENCOUNTER — Other Ambulatory Visit: Payer: Self-pay

## 2020-12-14 NOTE — Telephone Encounter (Signed)
The pt did not respond to video link or phone call and no VM option was set up

## 2021-04-08 ENCOUNTER — Emergency Department (HOSPITAL_COMMUNITY)
Admission: EM | Admit: 2021-04-08 | Discharge: 2021-04-08 | Disposition: A | Discharge status: Home / Self Care | Payer: Medicaid Other | Attending: Emergency Medicine | Admitting: Emergency Medicine

## 2021-04-08 ENCOUNTER — Encounter (HOSPITAL_COMMUNITY): Payer: Self-pay | Admitting: *Deleted

## 2021-04-08 ENCOUNTER — Emergency Department (HOSPITAL_COMMUNITY): Payer: Medicaid Other

## 2021-04-08 ENCOUNTER — Other Ambulatory Visit: Payer: Self-pay

## 2021-04-08 DIAGNOSIS — Z3A01 Less than 8 weeks gestation of pregnancy: Secondary | ICD-10-CM | POA: Insufficient documentation

## 2021-04-08 DIAGNOSIS — Z5321 Procedure and treatment not carried out due to patient leaving prior to being seen by health care provider: Secondary | ICD-10-CM | POA: Insufficient documentation

## 2021-04-08 DIAGNOSIS — O208 Other hemorrhage in early pregnancy: Secondary | ICD-10-CM | POA: Insufficient documentation

## 2021-04-08 DIAGNOSIS — O26891 Other specified pregnancy related conditions, first trimester: Secondary | ICD-10-CM | POA: Insufficient documentation

## 2021-04-08 DIAGNOSIS — O26899 Other specified pregnancy related conditions, unspecified trimester: Secondary | ICD-10-CM

## 2021-04-08 DIAGNOSIS — O209 Hemorrhage in early pregnancy, unspecified: Secondary | ICD-10-CM

## 2021-04-08 DIAGNOSIS — R1030 Lower abdominal pain, unspecified: Secondary | ICD-10-CM | POA: Diagnosis not present

## 2021-04-08 LAB — BASIC METABOLIC PANEL
Anion gap: 7 (ref 5–15)
BUN: 8 mg/dL (ref 6–20)
CO2: 24 mmol/L (ref 22–32)
Calcium: 9.3 mg/dL (ref 8.9–10.3)
Chloride: 107 mmol/L (ref 98–111)
Creatinine, Ser: 0.8 mg/dL (ref 0.44–1.00)
GFR, Estimated: >60 mL/min (ref >60)
Glucose, Bld: 103 mg/dL — ABNORMAL HIGH (ref 70–99)
Potassium: 3.9 mmol/L (ref 3.5–5.1)
Sodium: 138 mmol/L (ref 135–145)

## 2021-04-08 LAB — CBC WITH DIFFERENTIAL/PLATELET
Abs Immature Granulocytes: 0.04 10*3/uL (ref 0.00–0.07)
Basophils Absolute: 0.1 10*3/uL (ref 0.0–0.1)
Basophils Relative: 1 %
Eosinophils Absolute: 0.1 10*3/uL (ref 0.0–0.5)
Eosinophils Relative: 1 %
HCT: 40.4 % (ref 36.0–46.0)
Hemoglobin: 13 g/dL (ref 12.0–15.0)
Immature Granulocytes: 0 %
Lymphocytes Relative: 20 %
Lymphs Abs: 2.5 10*3/uL (ref 0.7–4.0)
MCH: 28.9 pg (ref 26.0–34.0)
MCHC: 32.2 g/dL (ref 30.0–36.0)
MCV: 89.8 fL (ref 80.0–100.0)
Monocytes Absolute: 1 10*3/uL (ref 0.1–1.0)
Monocytes Relative: 8 %
Neutro Abs: 8.8 10*3/uL — ABNORMAL HIGH (ref 1.7–7.7)
Neutrophils Relative %: 70 %
Platelets: 354 10*3/uL (ref 150–400)
RBC: 4.5 MIL/uL (ref 3.87–5.11)
RDW: 13.6 % (ref 11.5–15.5)
WBC: 12.6 10*3/uL — ABNORMAL HIGH (ref 4.0–10.5)
nRBC: 0 % (ref 0.0–0.2)
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
## 2021-04-11 ENCOUNTER — Telehealth: Payer: Self-pay

## 2021-04-11 NOTE — Telephone Encounter (Signed)
Transition Care Management Follow-up Telephone Call  Date of discharge and from where: 04/08/2021 from [facility]  How have you been since you were released from the hospital? Pt stated that she is feeling okay and that she plans to call OBGYN for a follow up appt.   Any questions or concerns? No  Items Reviewed:  Did the pt receive and understand the discharge instructions provided? Yes   Medications obtained and verified? Yes   Other? No   Any new allergies since your discharge? No   Dietary orders reviewed? N/a   Do you have support at home? Yes   Functional Questionnaire: (I = Independent and D = Dependent) ADLs: I  Bathing/Dressing- I  Meal Prep- I  Eating- I  Maintaining continence- I  Transferring/Ambulation- I  Managing Meds- I   Follow up appointments reviewed:   PCP Hospital f/u appt confirmed? No    Specialist Hospital f/u appt confirmed? No    Are transportation arrangements needed? No   If their condition worsens, is the pt aware to call PCP or go to the Emergency Dept.? Yes  Was the patient provided with contact information for the PCP's office or ED? Yes  Was to pt encouraged to call back with questions or concerns? Yes

## 2021-05-05 ENCOUNTER — Emergency Department (HOSPITAL_COMMUNITY)
Admission: EM | Admit: 2021-05-05 | Discharge: 2021-05-05 | Disposition: A | Discharge status: Home / Self Care | Payer: Medicaid Other | Attending: Emergency Medicine | Admitting: Emergency Medicine

## 2021-05-05 ENCOUNTER — Encounter (HOSPITAL_COMMUNITY): Payer: Self-pay

## 2021-05-05 ENCOUNTER — Emergency Department (HOSPITAL_COMMUNITY): Payer: Medicaid Other

## 2021-05-05 ENCOUNTER — Other Ambulatory Visit: Payer: Self-pay

## 2021-05-05 DIAGNOSIS — F17210 Nicotine dependence, cigarettes, uncomplicated: Secondary | ICD-10-CM | POA: Insufficient documentation

## 2021-05-05 DIAGNOSIS — O039 Complete or unspecified spontaneous abortion without complication: Secondary | ICD-10-CM | POA: Diagnosis not present

## 2021-05-05 DIAGNOSIS — R42 Dizziness and giddiness: Secondary | ICD-10-CM | POA: Diagnosis not present

## 2021-05-05 DIAGNOSIS — Z2913 Encounter for prophylactic Rho(D) immune globulin: Secondary | ICD-10-CM | POA: Insufficient documentation

## 2021-05-05 LAB — CBC WITH DIFFERENTIAL/PLATELET
Abs Immature Granulocytes: 0.08 10*3/uL — ABNORMAL HIGH (ref 0.00–0.07)
Basophils Absolute: 0.1 10*3/uL (ref 0.0–0.1)
Basophils Relative: 0 %
Eosinophils Absolute: 0.3 10*3/uL (ref 0.0–0.5)
Eosinophils Relative: 2 %
HCT: 33.4 % — ABNORMAL LOW (ref 36.0–46.0)
Hemoglobin: 10.7 g/dL — ABNORMAL LOW (ref 12.0–15.0)
Immature Granulocytes: 1 %
Lymphocytes Relative: 19 %
Lymphs Abs: 3.1 10*3/uL (ref 0.7–4.0)
MCH: 28.8 pg (ref 26.0–34.0)
MCHC: 32 g/dL (ref 30.0–36.0)
MCV: 89.8 fL (ref 80.0–100.0)
Monocytes Absolute: 1 10*3/uL (ref 0.1–1.0)
Monocytes Relative: 6 %
Neutro Abs: 11.8 10*3/uL — ABNORMAL HIGH (ref 1.7–7.7)
Neutrophils Relative %: 72 %
Platelets: 380 10*3/uL (ref 150–400)
RBC: 3.72 MIL/uL — ABNORMAL LOW (ref 3.87–5.11)
RDW: 13.8 % (ref 11.5–15.5)
WBC: 16.3 10*3/uL — ABNORMAL HIGH (ref 4.0–10.5)
nRBC: 0 % (ref 0.0–0.2)

## 2021-05-05 LAB — COMPREHENSIVE METABOLIC PANEL
ALT: 15 U/L (ref 0–44)
AST: 13 U/L — ABNORMAL LOW (ref 15–41)
Albumin: 3.6 g/dL (ref 3.5–5.0)
Alkaline Phosphatase: 62 U/L (ref 38–126)
Anion gap: 7 (ref 5–15)
BUN: 16 mg/dL (ref 6–20)
CO2: 25 mmol/L (ref 22–32)
Calcium: 8.9 mg/dL (ref 8.9–10.3)
Chloride: 106 mmol/L (ref 98–111)
Creatinine, Ser: 0.99 mg/dL (ref 0.44–1.00)
GFR, Estimated: >60 mL/min (ref >60)
Glucose, Bld: 98 mg/dL (ref 70–99)
Potassium: 4 mmol/L (ref 3.5–5.1)
Sodium: 138 mmol/L (ref 135–145)
Total Bilirubin: 0.2 mg/dL — ABNORMAL LOW (ref 0.3–1.2)
Total Protein: 7.1 g/dL (ref 6.5–8.1)

## 2021-05-05 LAB — HCG, QUANTITATIVE, PREGNANCY: hCG, Beta Chain, Quant, S: 2248 m[IU]/mL — ABNORMAL HIGH (ref <5)

## 2021-05-05 LAB — ABO/RH: ABO/RH(D): O NEG

## 2021-05-05 MED ORDER — SODIUM CHLORIDE 0.9 % IV BOLUS
1000.0000 mL | Freq: Once | INTRAVENOUS | Status: AC
  Administered 2021-05-05: 1000 mL via INTRAVENOUS

## 2021-05-05 MED ORDER — RHO D IMMUNE GLOBULIN 1500 UNIT/2ML IJ SOSY
300.0000 ug | PREFILLED_SYRINGE | Freq: Once | INTRAMUSCULAR | Status: AC
  Administered 2021-05-05: 300 ug via INTRAMUSCULAR

## 2021-05-05 MED ORDER — HYDROMORPHONE HCL 1 MG/ML IJ SOLN
0.5000 mg | Freq: Once | INTRAMUSCULAR | Status: AC
  Administered 2021-05-05: 0.5 mg via INTRAVENOUS
  Filled 2021-05-05: qty 1

## 2021-05-05 NOTE — ED Triage Notes (Addendum)
BIB [facility] following vaginal bleeding beginning at 6 am. Pt reports bleeding through 7 adult diapers. G7P5, [redacted]W[redacted]D pregnant.

## 2021-05-05 NOTE — ED Notes (Addendum)
Pt went to restroom she passed a large looking blood clot at this time.Pt stated she was told to save any clots for evaluation . Sample at there bedside at this room .Mary Terry

## 2021-05-05 NOTE — ED Notes (Signed)
Pt c/o contractions at this time. [facility] notified.

## 2021-05-05 NOTE — Discharge Instructions (Addendum)
Call your OB/GYN doctor tomorrow and tell them that you were seen here and you been having the bleeding without taking that medicine.
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
## 2021-05-06 ENCOUNTER — Telehealth: Payer: Self-pay | Admitting: *Deleted

## 2021-05-06 LAB — RH IG WORKUP (INCLUDES ABO/RH)
Antibody Screen: NEGATIVE
Unit division: 0

## 2021-05-06 NOTE — Telephone Encounter (Signed)
Transition Care Management Unsuccessful Follow-up Telephone Call  Date of discharge and from where:  05/05/2021 - [facility]  Attempts:  1st Attempt  Reason for unsuccessful TCM follow-up call:  Left voice message

## 2021-05-10 NOTE — Telephone Encounter (Signed)
Transition Care Management Unsuccessful Follow-up Telephone Call  Date of discharge and from where:  05/05/2021 - [facility]  Attempts:  2nd Attempt  Reason for unsuccessful TCM follow-up call:  Left voice message

## 2021-05-11 NOTE — Telephone Encounter (Signed)
Transition Care Management Unsuccessful Follow-up Telephone Call  Date of discharge and from where:  05/05/2021 - [facility]  Attempts:  3rd Attempt  Reason for unsuccessful TCM follow-up call:  Left voice message

## 2022-03-24 HISTORY — PX: TUBAL LIGATION: SHX77

## 2022-03-31 LAB — HM PAP SMEAR: HM Pap smear: NEGATIVE

## 2022-06-15 NOTE — Progress Notes (Signed)
New patient visit  I,Mary Terry,acting as a scribe for [employer], PA-C.,have documented all relevant documentation on the behalf of Mary Kirschner, PA-C,as directed by  Mary Kirschner, PA-C while in the presence of Mary Kirschner, PA-C.   Patient: Mary Terry   DOB: 01-07-1991   31 y.o. Female  MRN: [id number] Visit Date: 06/16/2022  Today's healthcare provider: Mikey Kirschner, PA-C   Chief Complaint  Patient presents with   New Patient (Initial Visit)   Subjective    Mary Terry is a 31 y.o. female who presents today as a new patient to establish care. Reports no previous PCP. HPI  Mary Terry is a 31 y/o Z7Q7 female with a PMH of pre-eclampsia, bipolar 1, who presents today to establish care. Denies any current concerns. She is still breastfeeding, but weaning.  Past Medical History:  Diagnosis Date   Anemia    Anxiety    Bipolar 1 disorder (HCC)    Depression    Frequent headaches    Herpes genitalis    History of substance abuse ([place])    Hx of preeclampsia, prior pregnancy,    Past Surgical History:  Procedure Laterality Date   NO PAST SURGERIES     TUBAL LIGATION  03/24/2022   Family Status  Relation Name Status   Mother  Alive   Father  Alive   Brother  Alive   Daughter  Alive   Daughter  Alive   Son  Alive   MGM  Alive   MGF  Alive   [place]  Alive   PGF  Alive   Family History  Problem Relation Age of Onset   Hypercholesterolemia Mother    Hypertension Father    Diabetes Maternal Grandfather    Hypertension Paternal Grandmother    Social History   Socioeconomic History   Marital status: Single    Spouse name: Not on file   Number of children: Not on file   Years of education: College   Highest education level: Not on file  Occupational History   Not on file  Tobacco Use   Smoking status: Every Day    Packs/day: 0.40    Years: 6.00    Total pack years: 2.40    Types: Cigarettes    Last attempt to quit: 11/06/2016    Years  since quitting: 5.6   Smokeless tobacco: Never   Tobacco comments:    8 cigarettes daily   Vaping Use   Vaping Use: Never used  Substance and Sexual Activity   Alcohol use: Yes    Comment: 4 shots of Liquor every other day   Drug use: No   Sexual activity: Yes    Birth control/protection: None    Comment: no contreceptive or condom use   Other Topics Concern   Not on file  Social History Narrative   Not on file   Social Determinants of Health   Financial Resource Strain: Not on file  Food Insecurity: Not on file  Transportation Needs: Not on file  Physical Activity: Not on file  Stress: Not on file  Social Connections: Not on file   Outpatient Medications Prior to Visit  Medication Sig   acetaminophen (TYLENOL) 500 MG tablet Take 1,000 mg by mouth every 6 (six) hours as needed.   ferrous sulfate (FERROUSUL) 325 (65 FE) MG tablet Take 1 tablet (325 mg total) by mouth daily with breakfast.   valACYclovir (VALTREX) 500 MG tablet Take 500 mg by mouth daily.   [  DISCONTINUED] docusate sodium (COLACE) 100 MG capsule Take 1 capsule (100 mg total) by mouth daily as needed for mild constipation.   [DISCONTINUED] ibuprofen (ADVIL) 600 MG tablet Take 1 tablet (600 mg total) by mouth every 6 (six) hours as needed.   [DISCONTINUED] labetalol (NORMODYNE) 100 MG tablet Take 1 tablet (100 mg total) by mouth 2 (two) times daily.   [DISCONTINUED] NIFEdipine (PROCARDIA-XL/NIFEDICAL-XL) 30 MG 24 hr tablet Take 60 mg by mouth daily. (Patient not taking: Reported on 06/16/2022)   No facility-administered medications prior to visit.   Allergies  Allergen Reactions   No Known Allergies    Percocet [Oxycodone-Acetaminophen] Itching    Patient denies this allergy 03/27/19    Immunization History  Administered Date(s) Administered   Dtap, Unspecified 10/26/1993, 12/26/1993, 03/07/1994, 12/05/1994, 12/23/1997   HPV Quadrivalent 02/21/2007, 04/24/2007, 08/26/2007   Hepatitis A 11/01/2005,  02/21/2007   Hepatitis B Dec 13, 1992, 10/26/1993, 03/07/1994   HiB (PRP-OMP) 10/26/1993, 12/26/1993, 03/07/1994, 12/05/1994   Influenza, Seasonal, Injecte, Preservative Fre 08/14/2008   Influenza,inj,Quad PF,6+ Mos 07/29/2020   Influenza-Unspecified 09/26/2002, 11/02/2021   MMR 12/05/1994, 12/23/1997   Meningococcal B, Unspecified 02/21/2007   OPV 10/26/1993, 12/26/1993, 03/07/1994, 12/23/1997   Tdap 11/01/2005, 04/30/2017, 04/26/2018, 06/08/2020, 12/23/2021    Health Maintenance  Topic Date Due   COVID-19 Vaccine (1) Never done   INFLUENZA VACCINE  06/06/2022   Hepatitis C Screening  06/17/2023 (Originally 09/02/2011)   PAP-Cervical Cytology Screening  03/31/2025   PAP SMEAR-Modifier  03/31/2025   TETANUS/TDAP  12/24/2031   HPV VACCINES  Completed   HIV Screening  Completed    Patient Care Team: Mary Kirschner, PA-C as PCP - General (Physician Assistant)  Review of Systems  HENT:  Positive for dental problem.   Musculoskeletal:  Positive for myalgias, neck pain and neck stiffness.  Psychiatric/Behavioral:  Positive for dysphoric mood. The patient is nervous/anxious.      Objective    BP 115/73 (BP Location: Left Arm, Patient Position: Sitting, Cuff Size: Large)   Pulse 95   Temp 98.5 F (36.9 C) (Oral)   Resp 16   Ht $R'5\' 4"'Ax$  (1.626 m)   Wt 212 lb (96.2 kg)   LMP 05/16/2022 Comment: Tubal ligation 03/24/2022  Breastfeeding Yes Comment: but weaning  BMI 36.39 kg/m    Physical Exam Constitutional:      General: She is awake.     Appearance: She is well-developed.  HENT:     Head: Normocephalic.  Eyes:     Conjunctiva/sclera: Conjunctivae normal.  Cardiovascular:     Rate and Rhythm: Normal rate and regular rhythm.     Heart sounds: Normal heart sounds.  Pulmonary:     Effort: Pulmonary effort is normal.     Breath sounds: Normal breath sounds.  Musculoskeletal:     Right lower leg: No edema.     Left lower leg: No edema.  Skin:    General: Skin is warm.   Neurological:     Mental Status: She is alert and oriented to person, place, and time.  Psychiatric:        Attention and Perception: Attention normal.        Mood and Affect: Mood normal.        Speech: Speech normal.        Behavior: Behavior is cooperative.    Depression Screen    06/16/2022    9:11 AM 05/20/2019   10:44 AM  PHQ 2/9 Scores  PHQ - 2 Score 1 6  PHQ- 9  Score 6 21   Results for orders placed or performed in visit on 06/16/22  HM PAP SMEAR  Result Value Ref Range   HM Pap smear Negative     Assessment & Plan      Problem List Items Addressed This Visit       Genitourinary   Genital herpes    Pt has valtrex on hand for flares      Relevant Medications   valACYclovir (VALTREX) 500 MG tablet     Other   History of pre-eclampsia - Primary    WNR today unmedicated. Advised pt no need for HTN meds      Obesity (BMI 30-39.9)    Reviewed diet, exercise Will check a1c, lipid panel, at pt request      Relevant Orders   CBC w/Diff/Platelet   Comprehensive Metabolic Panel (CMET)   Lipid Profile   HgB A1c   Bipolar 1 disorder (HCC)    Historically, currently unmedicated.  Advised psychiatry, psychology, pt feels able to handle herself. Can always discuss in future.         Return in about 6 months (around 12/17/2022) for CPE.     I, Mary Kirschner, PA-C have reviewed all documentation for this visit. The documentation on  06/16/2022  for the exam, diagnosis, procedures, and orders are all accurate and complete.  Mary Kirschner, PA-C [facility] [address] [place], [place], [address] Office: [phone] Fax: [place]

## 2022-06-16 ENCOUNTER — Ambulatory Visit (INDEPENDENT_AMBULATORY_CARE_PROVIDER_SITE_OTHER): Payer: Medicaid Other | Admitting: Physician Assistant

## 2022-06-16 ENCOUNTER — Encounter: Payer: Self-pay | Admitting: Physician Assistant

## 2022-06-16 VITALS — BP 115/73 | HR 95 | Temp 98.5°F | Resp 16 | Ht 64.0 in | Wt 212.0 lb

## 2022-06-16 DIAGNOSIS — E669 Obesity, unspecified: Secondary | ICD-10-CM | POA: Diagnosis not present

## 2022-06-16 DIAGNOSIS — Z8759 Personal history of other complications of pregnancy, childbirth and the puerperium: Secondary | ICD-10-CM

## 2022-06-16 DIAGNOSIS — A6004 Herpesviral vulvovaginitis: Secondary | ICD-10-CM | POA: Diagnosis not present

## 2022-06-16 DIAGNOSIS — F319 Bipolar disorder, unspecified: Secondary | ICD-10-CM | POA: Diagnosis not present

## 2022-06-16 NOTE — Assessment & Plan Note (Addendum)
Reviewed diet, exercise Will check a1c, lipid panel, at pt request

## 2022-06-16 NOTE — Patient Instructions (Signed)
Emergency Mental Health Services: ? ?[facility] (like an urgent care for mental health) ?[address], [place], [place] [address] ?[phone] ? ?[facility] ?Several different mental health services, has a crisis center ? ?[place] [place] [place] ?[address], [place], [place] [address] ? [phone] ?Same-Day Access Hours:  Monday, Wednesday and Friday, 8am - 3pm ?Crisis & Diversion Center: ?Walk-In Crisis Hours: 7 days/week, 8am - 12am ?-  Community Walk-In Services ?-  Law Enforcement Drop-Off (side door) ? ?

## 2022-06-16 NOTE — Assessment & Plan Note (Signed)
Historically, currently unmedicated.  Advised psychiatry, psychology, pt feels able to handle herself. Can always discuss in future.

## 2022-06-16 NOTE — Assessment & Plan Note (Signed)
Pt has valtrex on hand for flares

## 2022-06-16 NOTE — Assessment & Plan Note (Signed)
WNR today unmedicated. Advised pt no need for HTN meds

## 2022-06-17 LAB — HEMOGLOBIN A1C
Est. average glucose Bld gHb Est-mCnc: 105 mg/dL
Hgb A1c MFr Bld: 5.3 % (ref 4.8–5.6)

## 2022-06-17 LAB — COMPREHENSIVE METABOLIC PANEL
ALT: 25 IU/L (ref 0–32)
AST: 22 IU/L (ref 0–40)
Albumin/Globulin Ratio: 2 (ref 1.2–2.2)
Albumin: 5.1 g/dL — ABNORMAL HIGH (ref 4.0–5.0)
Alkaline Phosphatase: 78 IU/L (ref 44–121)
BUN/Creatinine Ratio: 12 (ref 9–23)
BUN: 12 mg/dL (ref 6–20)
Bilirubin Total: <0.2 mg/dL (ref 0.0–1.2)
CO2: 20 mmol/L (ref 20–29)
Calcium: 10 mg/dL (ref 8.7–10.2)
Chloride: 104 mmol/L (ref 96–106)
Creatinine, Ser: 0.97 mg/dL (ref 0.57–1.00)
Globulin, Total: 2.5 g/dL (ref 1.5–4.5)
Glucose: 89 mg/dL (ref 70–99)
Potassium: 5 mmol/L (ref 3.5–5.2)
Sodium: 145 mmol/L — ABNORMAL HIGH (ref 134–144)
Total Protein: 7.6 g/dL (ref 6.0–8.5)
eGFR: 82 mL/min/{1.73_m2} (ref >59)

## 2022-06-17 LAB — LIPID PANEL
Chol/HDL Ratio: 4.4 ratio (ref 0.0–4.4)
Cholesterol, Total: 235 mg/dL — ABNORMAL HIGH (ref 100–199)
HDL: 53 mg/dL (ref >39)
LDL Chol Calc (NIH): 154 mg/dL — ABNORMAL HIGH (ref 0–99)
Triglycerides: 158 mg/dL — ABNORMAL HIGH (ref 0–149)
VLDL Cholesterol Cal: 28 mg/dL (ref 5–40)

## 2022-06-17 LAB — CBC WITH DIFFERENTIAL/PLATELET
Basophils Absolute: 0.1 10*3/uL (ref 0.0–0.2)
Basos: 1 %
EOS (ABSOLUTE): 0.3 10*3/uL (ref 0.0–0.4)
Eos: 3 %
Hematocrit: 42.4 % (ref 34.0–46.6)
Hemoglobin: 13.9 g/dL (ref 11.1–15.9)
Immature Grans (Abs): 0 10*3/uL (ref 0.0–0.1)
Immature Granulocytes: 0 %
Lymphocytes Absolute: 3.1 10*3/uL (ref 0.7–3.1)
Lymphs: 27 %
MCH: 28.1 pg (ref 26.6–33.0)
MCHC: 32.8 g/dL (ref 31.5–35.7)
MCV: 86 fL (ref 79–97)
Monocytes Absolute: 0.6 10*3/uL (ref 0.1–0.9)
Monocytes: 5 %
Neutrophils Absolute: 7.3 10*3/uL — ABNORMAL HIGH (ref 1.4–7.0)
Neutrophils: 64 %
Platelets: 341 10*3/uL (ref 150–450)
RBC: 4.94 x10E6/uL (ref 3.77–5.28)
RDW: 14.1 % (ref 11.7–15.4)
WBC: 11.5 10*3/uL — ABNORMAL HIGH (ref 3.4–10.8)

## 2022-06-19 ENCOUNTER — Other Ambulatory Visit: Payer: Self-pay

## 2022-06-19 DIAGNOSIS — D72829 Elevated white blood cell count, unspecified: Secondary | ICD-10-CM

## 2022-10-04 NOTE — Progress Notes (Unsigned)
      Established patient visit   Patient: Mary Terry   DOB: 11-10-1990   31 y.o. Female  MRN: [id number] Visit Date: 10/05/2022  Today's healthcare provider: Alfredia Ferguson, PA-C   No chief complaint on file.  Subjective    HPI  Sexually Transmitted Disease Check: Patient presents for sexually transmitted disease check. Sexual history reviewed with the patient. STD exposure: {std exposure:14027}.  Previous history of STD:  {stds:14358}. Current symptoms include {std sx :14029}.  Contraception: {contraceptive method:5051}.   Medications: Outpatient Medications Prior to Visit  Medication Sig   acetaminophen (TYLENOL) 500 MG tablet Take 1,000 mg by mouth every 6 (six) hours as needed.   ferrous sulfate (FERROUSUL) 325 (65 FE) MG tablet Take 1 tablet (325 mg total) by mouth daily with breakfast.   valACYclovir (VALTREX) 500 MG tablet Take 500 mg by mouth daily.   No facility-administered medications prior to visit.    Review of Systems  {Labs  Heme  Chem  Endocrine  Serology  Results Review (optional):23779}   Objective    There were no vitals taken for this visit. {Show previous vital signs (optional):23777}  Physical Exam  ***  No results found for any visits on 10/05/22.  Assessment & Plan     ***  No follow-ups on file.      {provider attestation***:1}   Alfredia Ferguson, PA-C  [facility] [phone] (phone) [phone] (fax)  [facility]

## 2022-10-05 ENCOUNTER — Ambulatory Visit (INDEPENDENT_AMBULATORY_CARE_PROVIDER_SITE_OTHER): Payer: Medicaid Other | Admitting: Physician Assistant

## 2022-10-05 ENCOUNTER — Encounter: Payer: Self-pay | Admitting: Physician Assistant

## 2022-10-05 VITALS — BP 116/71 | HR 67 | Temp 98.8°F | Wt 219.0 lb

## 2022-10-05 DIAGNOSIS — Z209 Contact with and (suspected) exposure to unspecified communicable disease: Secondary | ICD-10-CM | POA: Diagnosis not present

## 2022-10-05 DIAGNOSIS — Z113 Encounter for screening for infections with a predominantly sexual mode of transmission: Secondary | ICD-10-CM | POA: Diagnosis not present

## 2022-10-05 DIAGNOSIS — Z7251 High risk heterosexual behavior: Secondary | ICD-10-CM

## 2022-10-07 LAB — RPR: RPR Ser Ql: REACTIVE — AB

## 2022-10-07 LAB — RPR, QUANT+TP ABS (REFLEX)
Rapid Plasma Reagin, Quant: 1:4 {titer} — ABNORMAL HIGH
T Pallidum Abs: NONREACTIVE

## 2022-10-07 LAB — HIV ANTIBODY (ROUTINE TESTING W REFLEX): HIV Screen 4th Generation wRfx: NONREACTIVE

## 2022-10-09 IMAGING — US US OB < 14 WEEKS - US OB TV
1 series · 14 of 28 positions shown · non-contrast
Comparison: None

CLINICAL DATA: Injury yesterday.  Spotting.

EXAM:
OBSTETRIC <14 WK US AND TRANSVAGINAL OB US
TECHNIQUE: Both transabdominal and transvaginal ultrasound examinations were
performed for complete evaluation of the gestation as well as the
maternal uterus, adnexal regions, and pelvic cul-de-sac.
Transvaginal technique was performed to assess early pregnancy.

[Series 1: us ob less than 14 weeks with ob transvaginal · 14 of 56 slices shown]
[im 3/56]
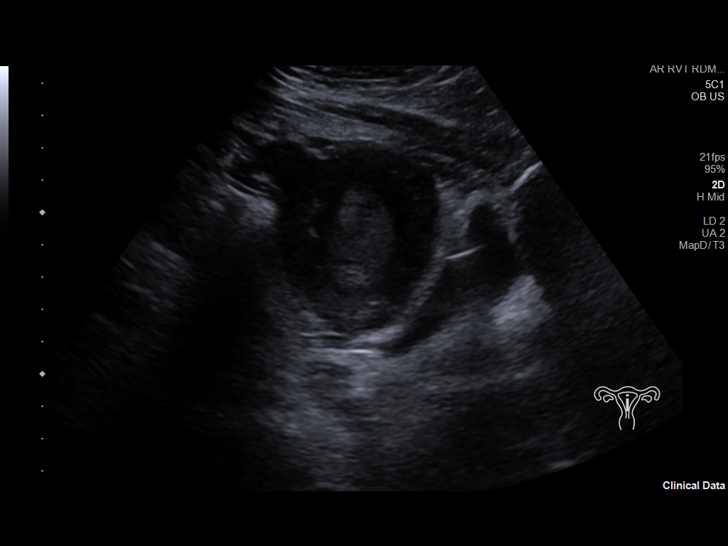
[im 7/56]
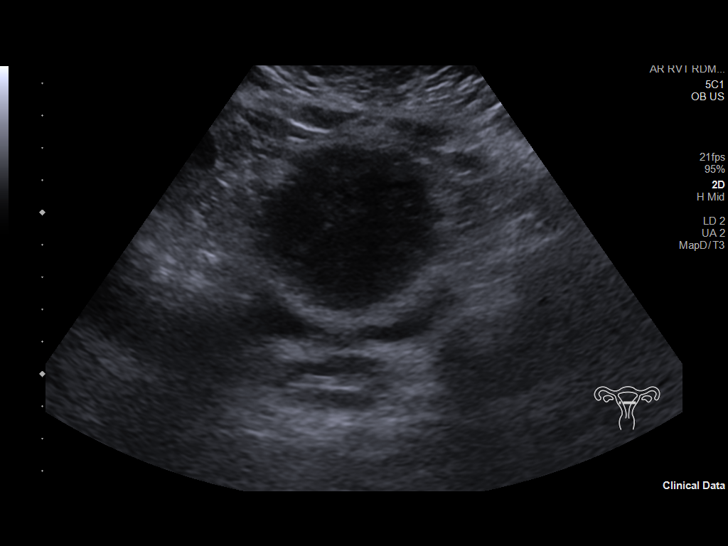
[im 11/56]
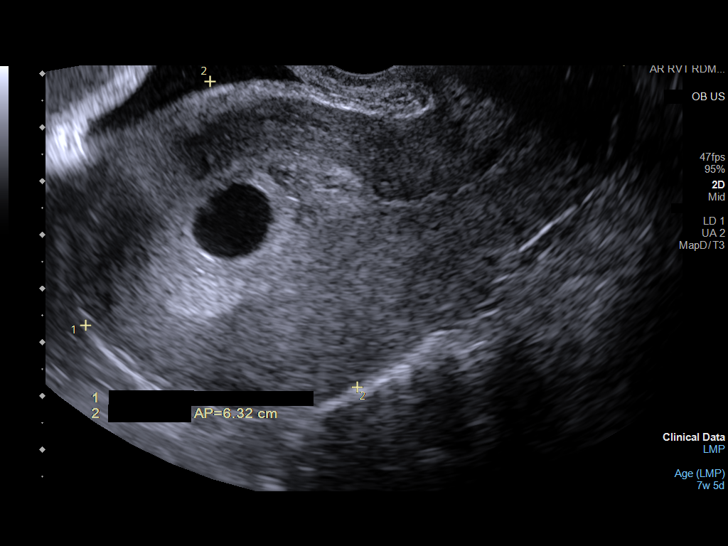
[im 15/56]
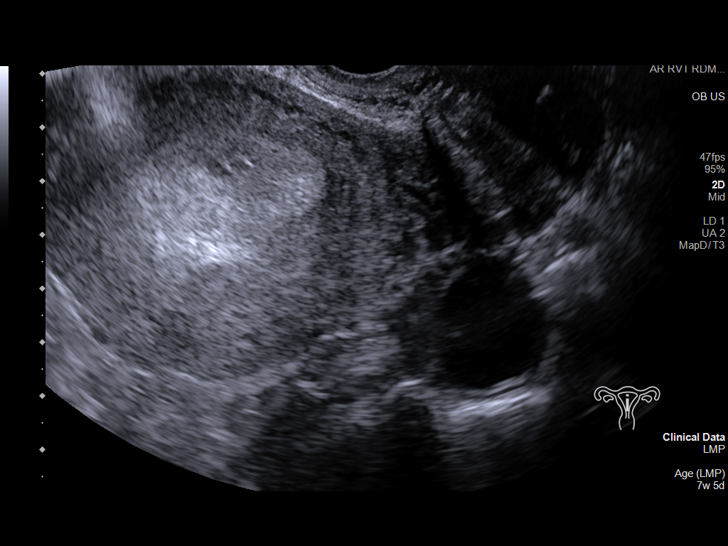
[im 19/56]
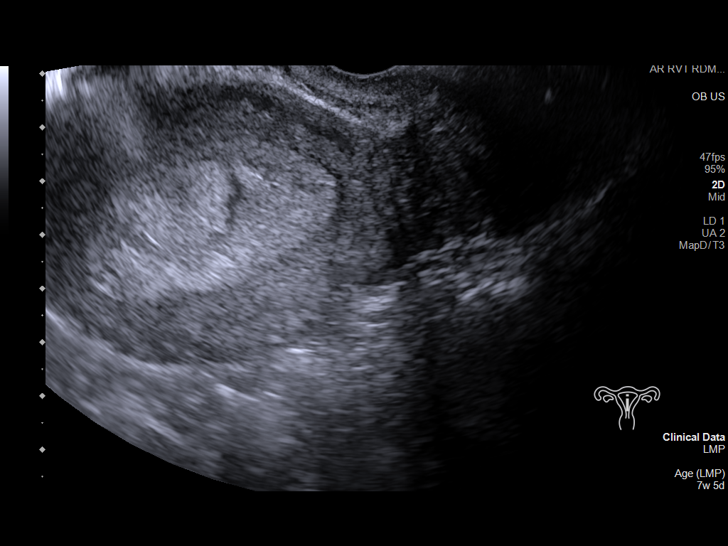
[im 23/56]
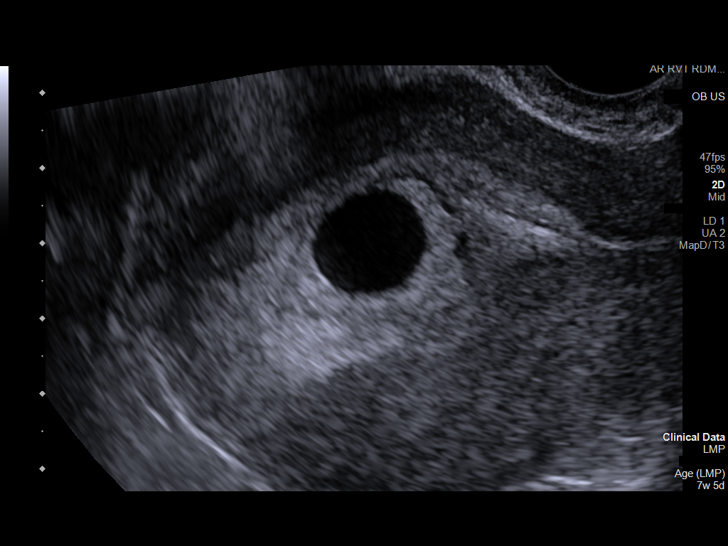
[im 27/56]
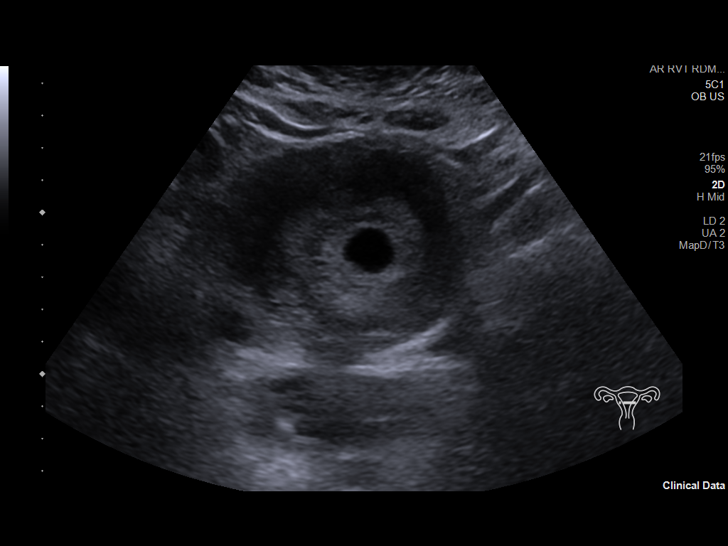
[im 31/56]
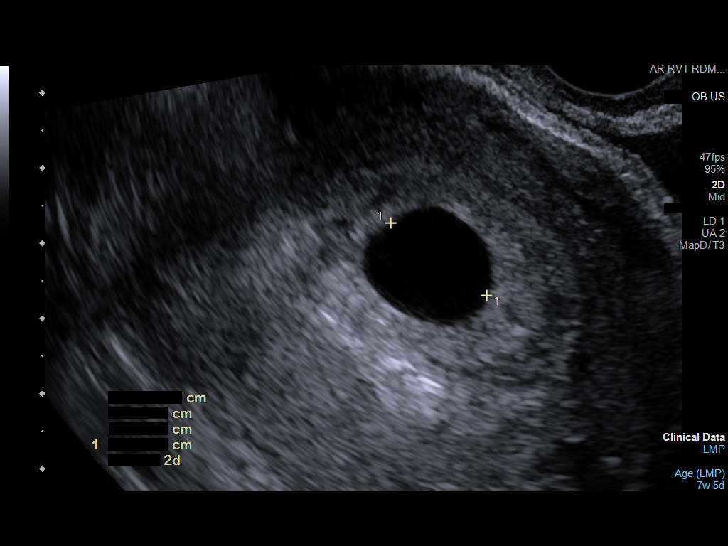
[im 35/56]
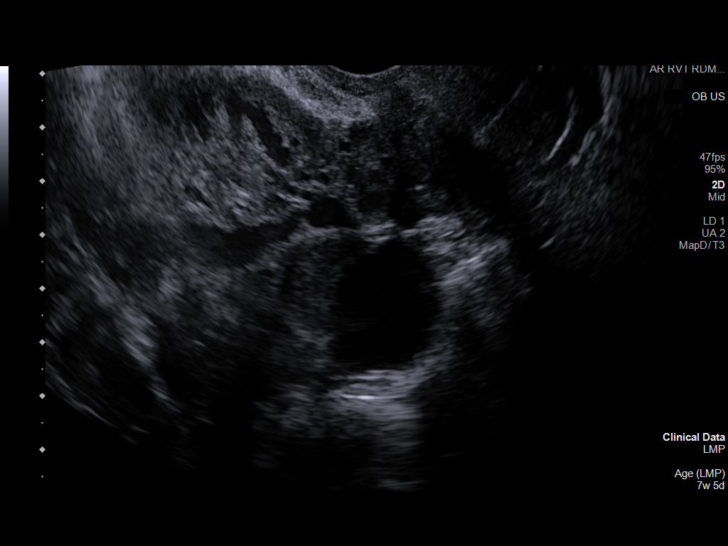
[im 39/56]
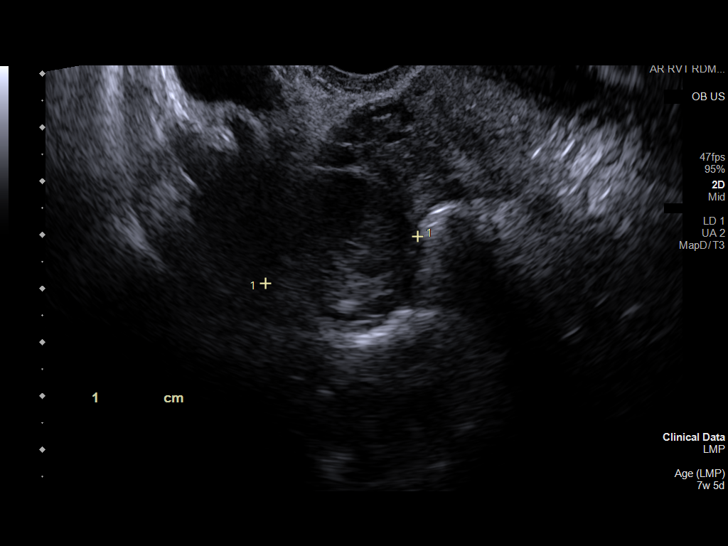
[im 43/56]
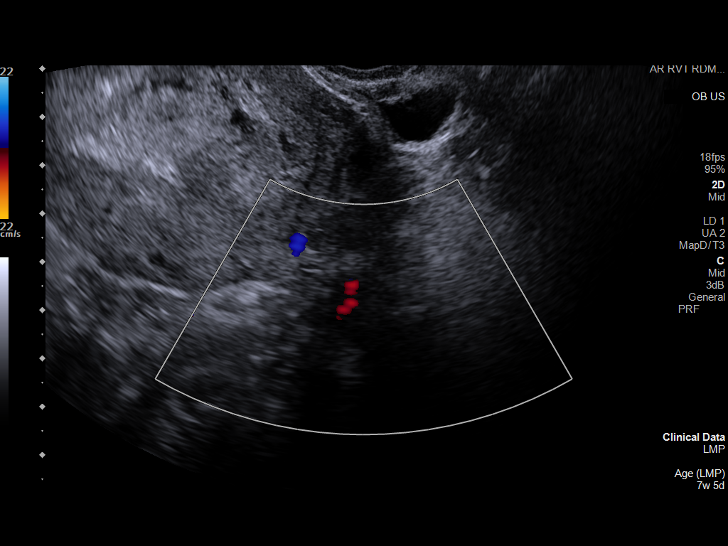
[im 47/56]
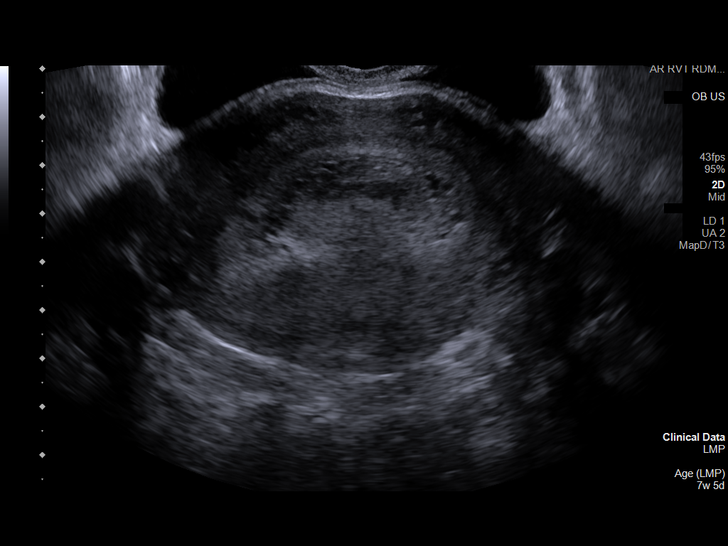
[im 51/56]
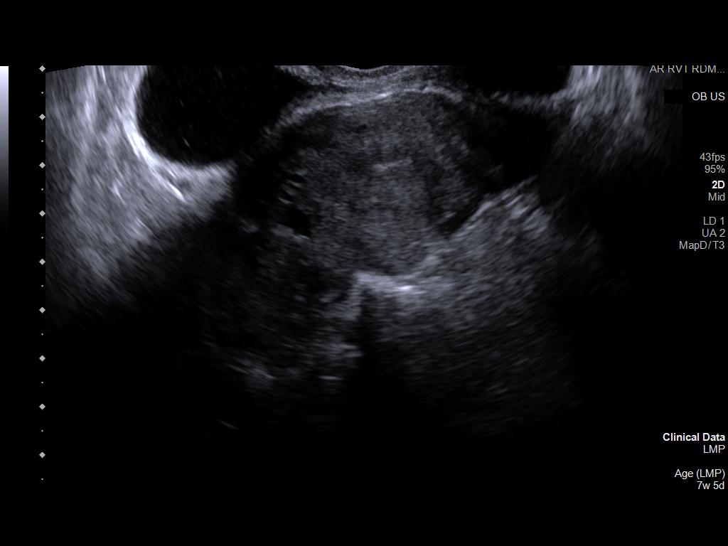
[im 56/56]
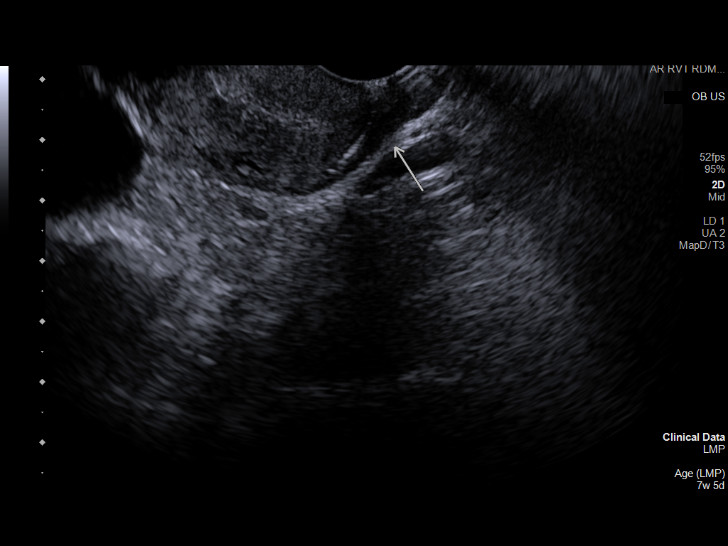

[14 of 28 positions shown; findings below may reference images not displayed]

FINDINGS: Intrauterine gestational sac: Single

Yolk sac:  Not Visualized.

Embryo:  Not Visualized.

Cardiac Activity: Not Visualized.

Heart Rate: Bpm

MSD: 14.4 mm   6 w   2 d

CRL:    mm    w    d                  US EDC:

Subchorionic hemorrhage:  Small subchorionic hemorrhage

Maternal uterus/adnexae: 2.4 cm right ovarian cyst, likely corpus
luteum. No adnexal mass. Trace free fluid.
IMPRESSION: Six week 2 day intrauterine gestational sac. No yolk sac or fetal
pole currently visualized. This could be followed with repeat
ultrasound in 14 days to ensure expected progression.

Small subchorionic hemorrhage.

## 2022-10-11 ENCOUNTER — Other Ambulatory Visit: Payer: Self-pay | Admitting: Physician Assistant

## 2022-10-11 DIAGNOSIS — B9689 Other specified bacterial agents as the cause of diseases classified elsewhere: Secondary | ICD-10-CM

## 2022-10-11 LAB — NUSWAB VAGINITIS PLUS (VG+)
Atopobium vaginae: HIGH Score — AB
BVAB 2: HIGH Score — AB
Candida albicans, NAA: NEGATIVE
Candida glabrata, NAA: NEGATIVE
Chlamydia trachomatis, NAA: NEGATIVE
Megasphaera 1: HIGH Score — AB
Neisseria gonorrhoeae, NAA: NEGATIVE
Trich vag by NAA: NEGATIVE

## 2022-10-11 LAB — SPECIMEN STATUS REPORT

## 2022-10-11 MED ORDER — METRONIDAZOLE 500 MG PO TABS: 500.0000 mg | ORAL_TABLET | Freq: Two times a day (BID) | ORAL | 0 refills | Status: AC

## 2022-11-05 IMAGING — CT CT HEAD W/O CM
3 of 4 series · 16 of 47 positions shown, 19 images · non-contrast
Comparison: Head CT dated 04/05/2014.

CLINICAL DATA: 29-year-old female with concern for intracranial
hemorrhage.

EXAM:
CT HEAD WITHOUT CONTRAST
TECHNIQUE: Contiguous axial images were obtained from the base of the skull
through the vertex without intravenous contrast.

[Series 2: head w o · axial · 0.45mm/px · z∈[+1673,+1808]mm · 10 of 33 slices shown, 13 images]
[im 4/33  brain]
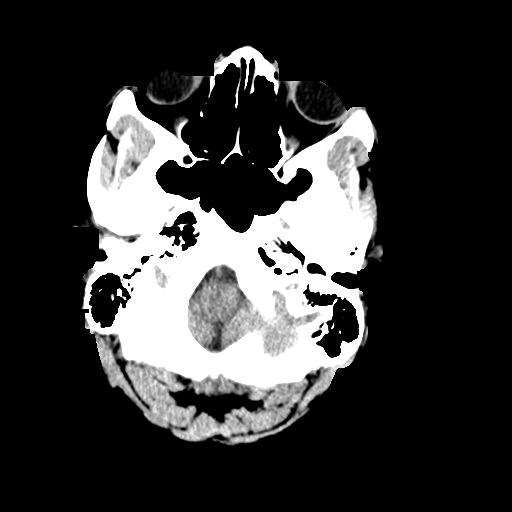
[im 4/33  bone]
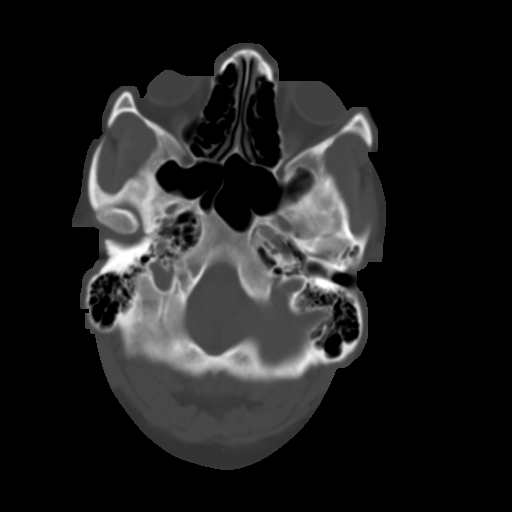
[im 7/33  brain]
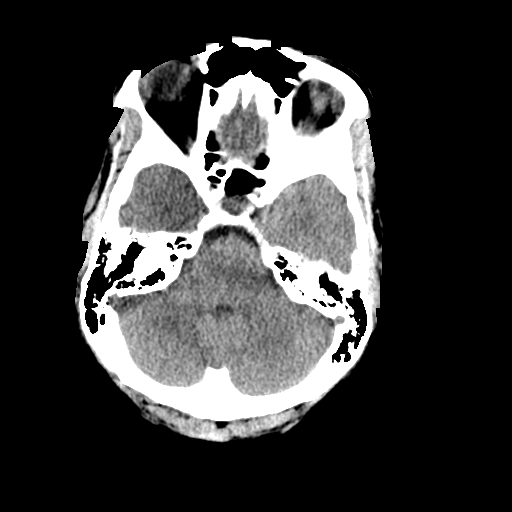
[im 10/33  brain]
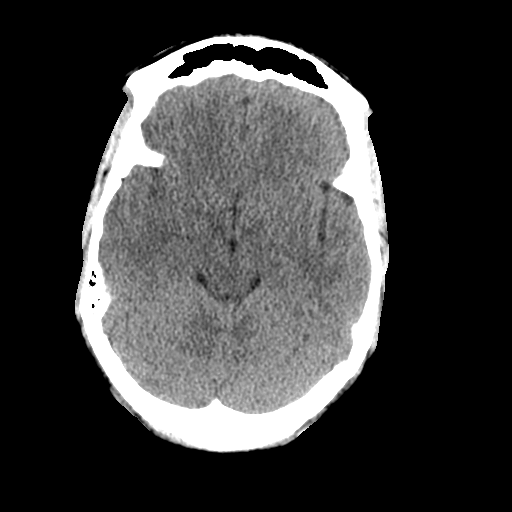
[im 13/33  brain]
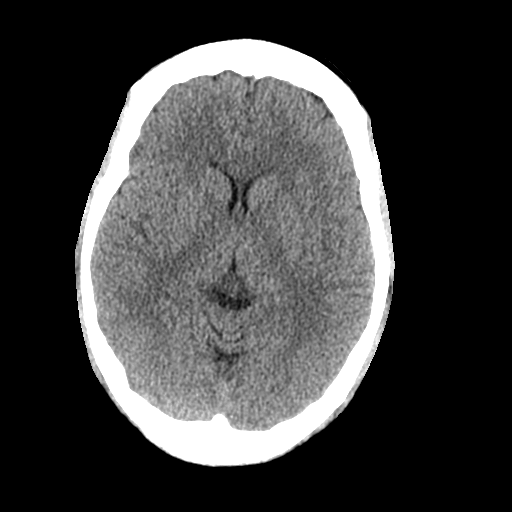
[im 16/33  brain]
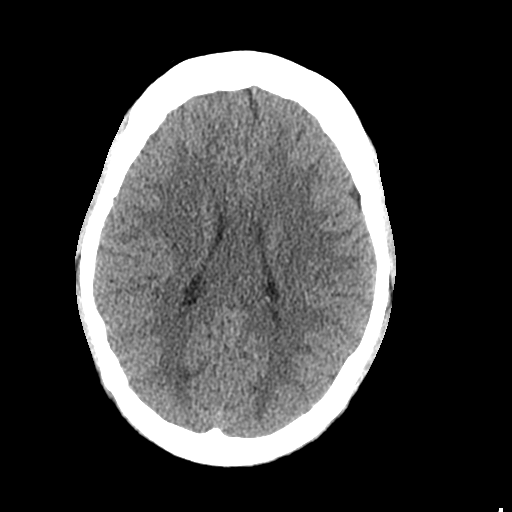
[im 16/33  bone]
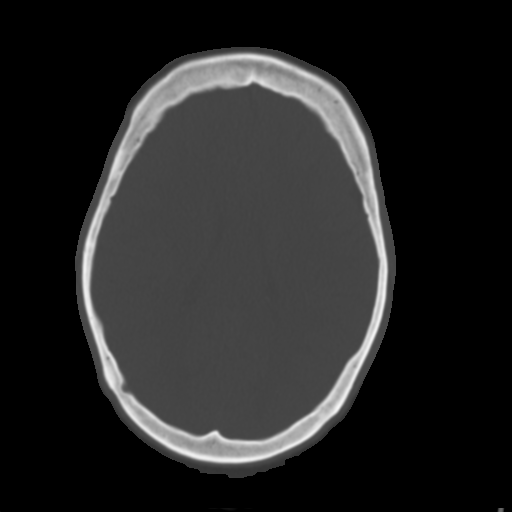
[im 19/33  brain]
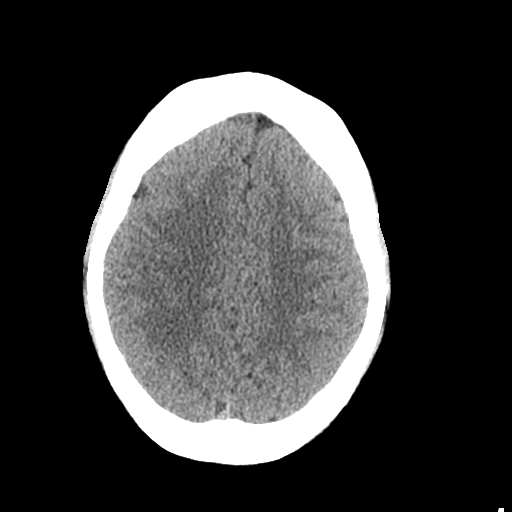
[im 22/33  brain]
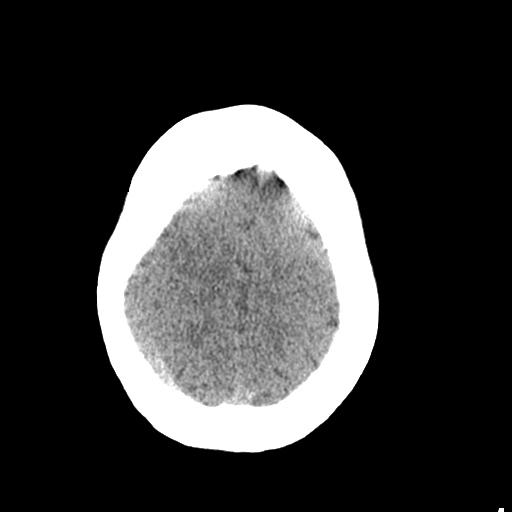
[im 25/33  brain]
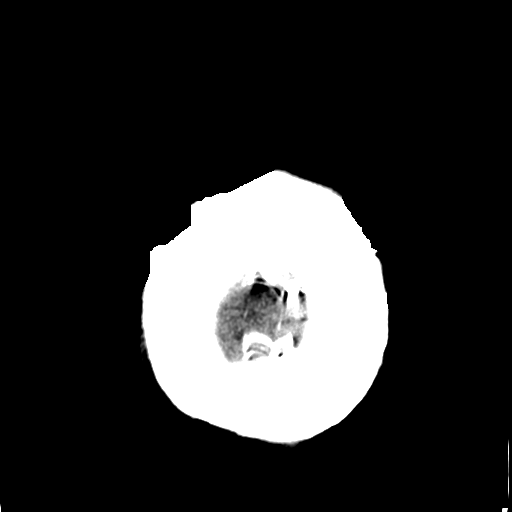
[im 28/33  brain]
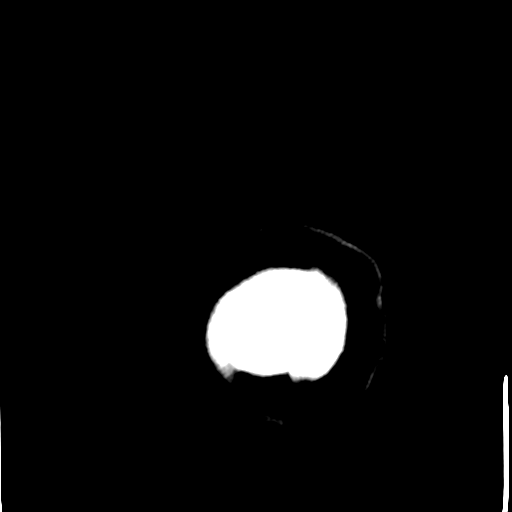
[im 28/33  bone]
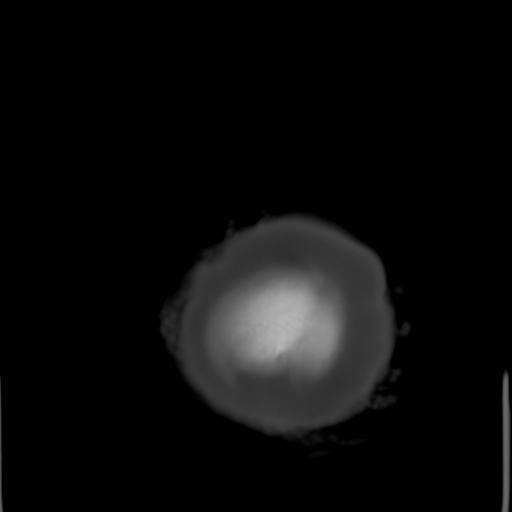
[im 31/33  brain]
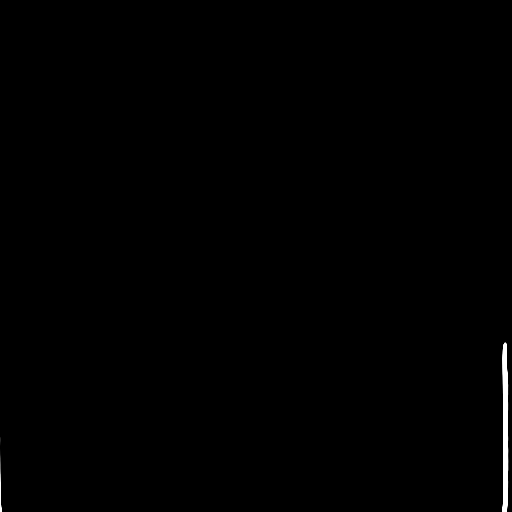

[Series 4: coronal soft · coronal · 0.30mm/px · 3 of 67 slices shown]
[im 23/67  brain]
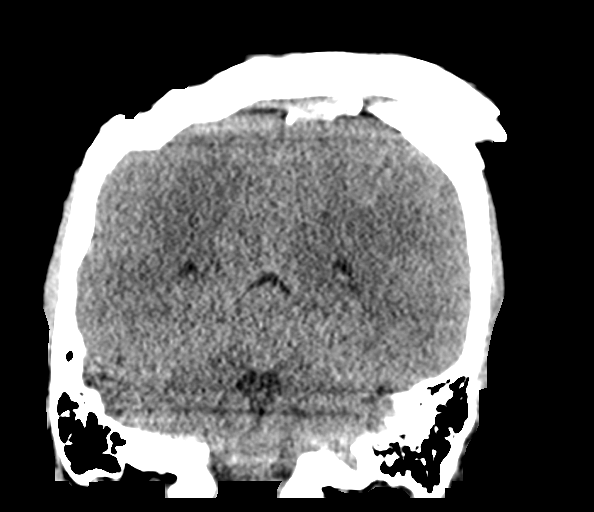
[im 30/67  brain]
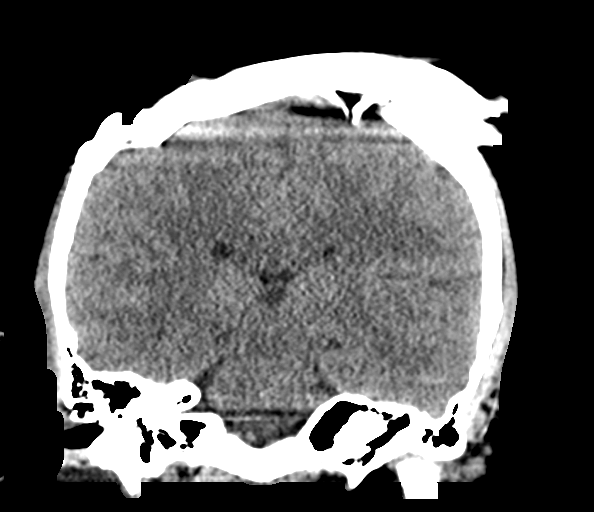
[im 37/67  brain]
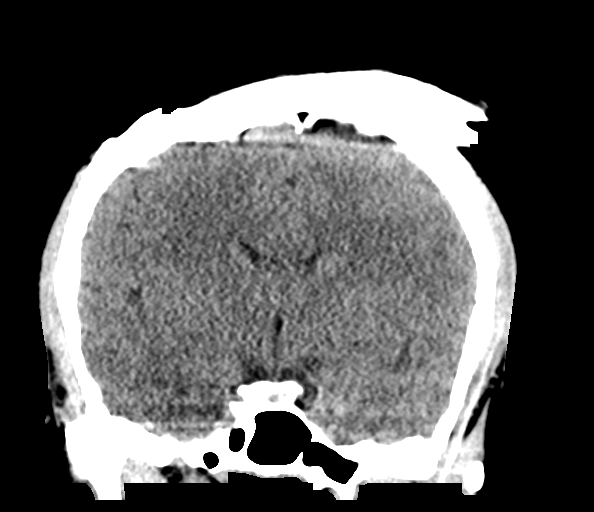

[Series 5: sagittal soft · sagittal · 0.35mm/px · 3 of 57 slices shown]
[im 19/57  brain]
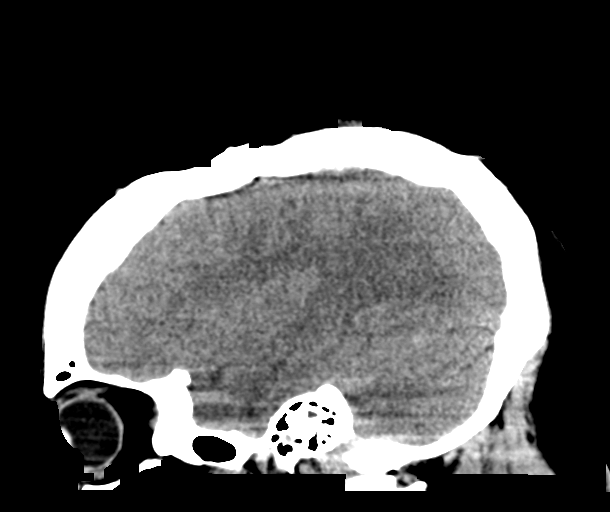
[im 29/57  brain]
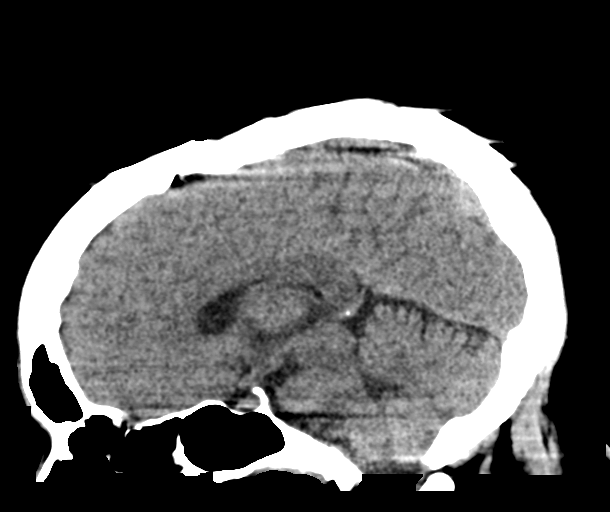
[im 38/57  brain]
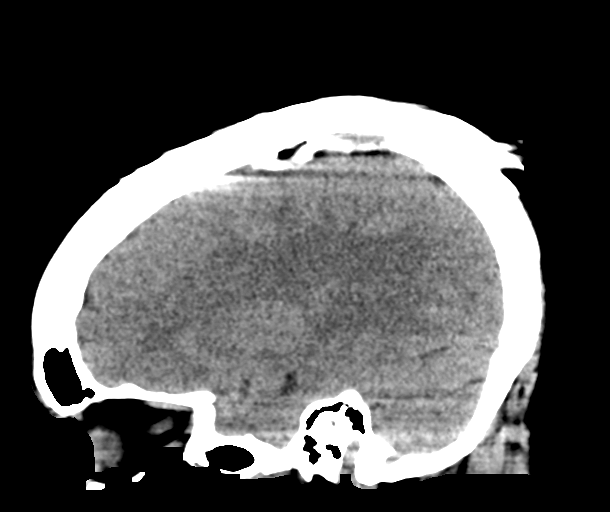

[16 of 47 positions shown; findings below may reference images not displayed]

FINDINGS: Evaluation of this exam is limited due to motion artifact.

Brain: No evidence of acute infarction, hemorrhage, hydrocephalus,
extra-axial collection or mass lesion/mass effect.

Vascular: No hyperdense vessel or unexpected calcification.

Skull: Normal. Negative for fracture or focal lesion.

Sinuses/Orbits: No acute finding.

Other: None
IMPRESSION: Unremarkable noncontrast CT of the brain.

## 2022-12-18 ENCOUNTER — Encounter: Payer: Medicaid Other | Admitting: Physician Assistant

## 2022-12-18 NOTE — Progress Notes (Deleted)
Complete physical exam   Patient: Mary Terry   DOB: 1991/07/27   32 y.o. Female  MRN: [id number] Visit Date: 12/18/2022  Today's healthcare provider: Mikey Kirschner, PA-C   No chief complaint on file.  Subjective    Mary Terry is a 32 y.o. female who presents today for a complete physical exam.  She reports consuming a {diet types:17450} diet. {Exercise:19826} She generally feels {well/fairly well/poorly:18703}. She reports sleeping {well/fairly well/poorly:18703}. She {does/does not:200015} have additional problems to discuss today.  HPI  ***  Past Medical History:  Diagnosis Date   Anemia    Anxiety    Bipolar 1 disorder (HCC)    Depression    Frequent headaches    Herpes genitalis    History of substance abuse ([place])    Hx of preeclampsia, prior pregnancy,    Past Surgical History:  Procedure Laterality Date   NO PAST SURGERIES     TUBAL LIGATION  03/24/2022   Social History   Socioeconomic History   Marital status: Single    Spouse name: Not on file   Number of children: Not on file   Years of education: College   Highest education level: Not on file  Occupational History   Not on file  Tobacco Use   Smoking status: Every Day    Packs/day: 0.40    Years: 6.00    Total pack years: 2.40    Types: Cigarettes    Last attempt to quit: 11/06/2016    Years since quitting: 6.1   Smokeless tobacco: Never   Tobacco comments:    8 cigarettes daily   Vaping Use   Vaping Use: Never used  Substance and Sexual Activity   Alcohol use: Yes    Comment: 4 shots of Liquor every other day   Drug use: No   Sexual activity: Yes    Birth control/protection: None    Comment: no contreceptive or condom use   Other Topics Concern   Not on file  Social History Narrative   Not on file   Social Determinants of Health   Financial Resource Strain: Not on file  Food Insecurity: Not on file  Transportation Needs: Not on file  Physical Activity: Not on file   Stress: Not on file  Social Connections: Not on file  Intimate Partner Violence: Not on file   Family Status  Relation Name Status   Mother  Alive   Father  Alive   Brother  Alive   Daughter  Alive   Daughter  Alive   Son  Alive   MGM  Alive   MGF  Alive   PGM  Alive   PGF  Alive   Family History  Problem Relation Age of Onset   Hypercholesterolemia Mother    Hypertension Father    Diabetes Maternal Grandfather    Hypertension Paternal Grandmother    Allergies  Allergen Reactions   No Known Allergies    Percocet [Oxycodone-Acetaminophen] Itching    Patient denies this allergy 03/27/19    Patient Care Team: Mikey Kirschner, PA-C as PCP - General (Physician Assistant)   Medications: Outpatient Medications Prior to Visit  Medication Sig   acetaminophen (TYLENOL) 500 MG tablet Take 1,000 mg by mouth every 6 (six) hours as needed.   ferrous sulfate (FERROUSUL) 325 (65 FE) MG tablet Take 1 tablet (325 mg total) by mouth daily with breakfast.   valACYclovir (VALTREX) 500 MG tablet Take 500 mg by mouth daily.  No facility-administered medications prior to visit.    Review of Systems   Objective    There were no vitals taken for this visit.    Physical Exam  ***  Last depression screening scores    10/05/2022   11:22 AM 06/16/2022    9:11 AM 05/20/2019   10:44 AM  PHQ 2/9 Scores  PHQ - 2 Score 0 1 6  PHQ- 9 Score 0 6 21   Last fall risk screening    10/05/2022   11:22 AM  [place] in the past year? 0  Number falls in past yr: 0  Injury with Fall? 0   Last Audit-C alcohol use screening    10/05/2022   11:22 AM  Alcohol Use Disorder Test (AUDIT)  1. How often do you have a drink containing alcohol? 2  2. How many drinks containing alcohol do you have on a typical day when you are drinking? 1  3. How often do you have six or more drinks on one occasion? 0  AUDIT-C Score 3   A score of 3 or more in women, and 4 or more in men indicates  increased risk for alcohol abuse, EXCEPT if all of the points are from question 1   No results found for any visits on 12/18/22.  Assessment & Plan    Routine Health Maintenance and Physical Exam  Exercise Activities and Dietary recommendations --balanced diet high in fiber and protein, low in sugars, carbs, fats. --physical activity/exercise 30 minutes 3-5 times a week    Immunization History  Administered Date(s) Administered   Dtap, Unspecified 10/26/1993, 12/26/1993, 03/07/1994, 12/05/1994, 12/23/1997   HIB (PRP-OMP) 10/26/1993, 12/26/1993, 03/07/1994, 12/05/1994   HPV Quadrivalent 02/21/2007, 04/24/2007, 08/26/2007   Hepatitis A 11/01/2005, 02/21/2007   Hepatitis B May 31, 1993, 10/26/1993, 03/07/1994   Influenza, Seasonal, Injecte, Preservative Fre 08/14/2008   Influenza,inj,Quad PF,6+ Mos 07/29/2020   Influenza-Unspecified 09/26/2002, 11/02/2021   MMR 12/05/1994, 12/23/1997   Meningococcal B, Unspecified 02/21/2007   OPV 10/26/1993, 12/26/1993, 03/07/1994, 12/23/1997   Tdap 11/01/2005, 04/30/2017, 04/26/2018, 06/08/2020, 12/23/2021    Health Maintenance  Topic Date Due   COVID-19 Vaccine (1) Never done   INFLUENZA VACCINE  02/04/2023 (Originally 06/06/2022)   Hepatitis C Screening  06/17/2023 (Originally 09/02/2011)   PAP-Cervical Cytology Screening  03/31/2025   PAP SMEAR-Modifier  03/31/2025   DTaP/Tdap/Td (11 - Td or Tdap) 12/24/2031   HPV VACCINES  Completed   HIV Screening  Completed    Discussed health benefits of physical activity, and encouraged her to engage in regular exercise appropriate for her age and condition.  ***  No follow-ups on file.     I, Mikey Kirschner, PA-C have reviewed all documentation for this visit. The documentation on  12/18/22  for the exam, diagnosis, procedures, and orders are all accurate and complete.  Mikey Kirschner, PA-C [facility] [address] [place], [place], [address] Office: [phone] Fax:  [place]

## 2024-06-27 ENCOUNTER — Ambulatory Visit

## 2024-06-27 DIAGNOSIS — Z113 Encounter for screening for infections with a predominantly sexual mode of transmission: Secondary | ICD-10-CM

## 2024-06-27 LAB — WET PREP FOR TRICH, YEAST, CLUE
Clue Cell Exam: POSITIVE — AB
Trichomonas Exam: NEGATIVE
Yeast Exam: NEGATIVE

## 2024-06-27 LAB — HM HIV SCREENING LAB: HM HIV Screening: NEGATIVE

## 2024-06-27 NOTE — Progress Notes (Signed)
 Pt is here for std screening, wet prep results reviewed with pt no treatment required per standing order. Larraine JONELLE [place], RN
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
## 2024-07-02 LAB — GONOCOCCUS CULTURE

## 2024-07-04 ENCOUNTER — Telehealth: Payer: Self-pay

## 2024-07-04 NOTE — Telephone Encounter (Signed)
 Provider to review syphilis results from 06/27/24 specimen. Call pt with TR and provider orders. (Hx of prior testing in Epic.)

## 2024-07-18 NOTE — Telephone Encounter (Signed)
 [facility] provider reviewed, confirmed false positive. ---------------------  Phone call to pt at [phone]. Left message stating RN with [facility] is calling re test results, please call Elsworth Ledin a [phone].

## 2024-07-21 NOTE — Telephone Encounter (Signed)
 Received return call from pt and pt confirmed identity. Discussed false positive results and answered pt's questions. Pt expressed understanding.
